# Patient Record
Sex: Female | Born: 1945
Health system: Southern US, Community
[De-identification: ages and names within clinical notes are randomized; demographics above are authoritative.]

## PROBLEM LIST (undated history)

## (undated) DIAGNOSIS — E785 Hyperlipidemia, unspecified: Secondary | ICD-10-CM

## (undated) DIAGNOSIS — I6529 Occlusion and stenosis of unspecified carotid artery: Secondary | ICD-10-CM

## (undated) DIAGNOSIS — I480 Paroxysmal atrial fibrillation: Secondary | ICD-10-CM

## (undated) DIAGNOSIS — I10 Essential (primary) hypertension: Secondary | ICD-10-CM

## (undated) DIAGNOSIS — J302 Other seasonal allergic rhinitis: Secondary | ICD-10-CM

## (undated) DIAGNOSIS — E039 Hypothyroidism, unspecified: Secondary | ICD-10-CM

## (undated) HISTORY — DX: Hyperlipidemia, unspecified: E78.5

## (undated) HISTORY — DX: Occlusion and stenosis of unspecified carotid artery: I65.29

## (undated) HISTORY — DX: Hypothyroidism, unspecified: E03.9

## (undated) HISTORY — PX: APPENDECTOMY: SHX54

## (undated) HISTORY — PX: TUBAL LIGATION: SHX77

## (undated) HISTORY — PX: CERVICAL POLYPECTOMY: SHX88

## (undated) HISTORY — PX: CAROTID ENDARTERECTOMY: SUR193

## (undated) HISTORY — DX: Essential (primary) hypertension: I10

## (undated) HISTORY — DX: Paroxysmal atrial fibrillation: I48.0

---

## 1981-03-25 HISTORY — PX: EXTERNAL EAR SURGERY: SHX627

## 1998-06-21 ENCOUNTER — Other Ambulatory Visit: Admission: RE | Admit: 1998-06-21 | Discharge: 1998-06-21 | Payer: Self-pay | Admitting: *Deleted

## 1999-03-02 HISTORY — PX: DOPPLER ECHOCARDIOGRAPHY: SHX263

## 1999-07-04 ENCOUNTER — Other Ambulatory Visit: Admission: RE | Admit: 1999-07-04 | Discharge: 1999-07-04 | Payer: Self-pay | Admitting: *Deleted

## 2000-08-20 ENCOUNTER — Other Ambulatory Visit: Admission: RE | Admit: 2000-08-20 | Discharge: 2000-08-20 | Payer: Self-pay | Admitting: *Deleted

## 2000-10-16 ENCOUNTER — Emergency Department (HOSPITAL_COMMUNITY): Admission: EM | Admit: 2000-10-16 | Discharge: 2000-10-16 | Payer: Self-pay | Admitting: Emergency Medicine

## 2002-02-09 ENCOUNTER — Other Ambulatory Visit: Admission: RE | Admit: 2002-02-09 | Discharge: 2002-02-09 | Payer: Self-pay | Admitting: *Deleted

## 2004-09-17 ENCOUNTER — Other Ambulatory Visit: Admission: RE | Admit: 2004-09-17 | Discharge: 2004-09-17 | Payer: Self-pay | Admitting: *Deleted

## 2005-12-03 ENCOUNTER — Other Ambulatory Visit: Admission: RE | Admit: 2005-12-03 | Discharge: 2005-12-03 | Payer: Self-pay | Admitting: *Deleted

## 2006-03-05 ENCOUNTER — Other Ambulatory Visit: Admission: RE | Admit: 2006-03-05 | Discharge: 2006-03-05 | Payer: Self-pay | Admitting: *Deleted

## 2007-06-03 ENCOUNTER — Other Ambulatory Visit: Admission: RE | Admit: 2007-06-03 | Discharge: 2007-06-03 | Payer: Self-pay | Admitting: *Deleted

## 2009-07-25 ENCOUNTER — Emergency Department (HOSPITAL_COMMUNITY): Admission: EM | Admit: 2009-07-25 | Discharge: 2009-07-25 | Payer: Self-pay | Admitting: Emergency Medicine

## 2010-01-04 ENCOUNTER — Ambulatory Visit: Payer: Self-pay | Admitting: Cardiology

## 2010-01-25 ENCOUNTER — Ambulatory Visit: Payer: Self-pay | Admitting: Cardiology

## 2010-06-28 ENCOUNTER — Other Ambulatory Visit: Payer: Self-pay | Admitting: Cardiology

## 2010-06-28 DIAGNOSIS — I10 Essential (primary) hypertension: Secondary | ICD-10-CM

## 2010-06-28 MED ORDER — AMLODIPINE BESYLATE 5 MG PO TABS: 5.0000 mg | ORAL_TABLET | Freq: Every day | ORAL | Status: DC

## 2010-06-28 NOTE — Telephone Encounter (Signed)
TRYING TO REFILL  AMLODIPINE 5MG /USES [facility] [phone] #. CALL PT AT HOME # LISTED AND LET HER KNOW WHEN IT HAS BEEN DONE AS SHE IS ON HER LAST PILL TODAY. PLACED CHART IN BOX.

## 2010-06-28 NOTE — Telephone Encounter (Signed)
Called stating [facility] and they have sent [place] request w/o response. LM W/ patient that I escribed refill. Spoke w/ pharmacy stated they have sent electronically x 2.

## 2010-07-24 ENCOUNTER — Other Ambulatory Visit: Payer: Self-pay | Admitting: Cardiology

## 2010-07-25 ENCOUNTER — Other Ambulatory Visit: Payer: Self-pay | Admitting: *Deleted

## 2010-07-25 MED ORDER — LISINOPRIL 20 MG PO TABS: ORAL_TABLET | ORAL | Status: DC

## 2010-07-25 NOTE — Telephone Encounter (Signed)
Refill done.  

## 2010-08-22 ENCOUNTER — Encounter: Payer: Self-pay | Admitting: Nurse Practitioner

## 2010-08-23 ENCOUNTER — Encounter: Payer: Self-pay | Admitting: Nurse Practitioner

## 2010-08-23 ENCOUNTER — Ambulatory Visit (INDEPENDENT_AMBULATORY_CARE_PROVIDER_SITE_OTHER): Payer: MEDICARE | Admitting: Nurse Practitioner

## 2010-08-23 VITALS — BP 160/90 | HR 60 | Ht 70.0 in | Wt 161.6 lb

## 2010-08-23 DIAGNOSIS — E039 Hypothyroidism, unspecified: Secondary | ICD-10-CM | POA: Insufficient documentation

## 2010-08-23 DIAGNOSIS — I4891 Unspecified atrial fibrillation: Secondary | ICD-10-CM

## 2010-08-23 DIAGNOSIS — I480 Paroxysmal atrial fibrillation: Secondary | ICD-10-CM

## 2010-08-23 DIAGNOSIS — I10 Essential (primary) hypertension: Secondary | ICD-10-CM

## 2010-08-23 LAB — LIPID PANEL
Cholesterol: 204 mg/dL — ABNORMAL HIGH (ref 0–200)
HDL: 50.3 mg/dL (ref >39.00)
Total CHOL/HDL Ratio: 4
Triglycerides: 132 mg/dL (ref 0.0–149.0)
VLDL: 26.4 mg/dL (ref 0.0–40.0)

## 2010-08-23 LAB — HEPATIC FUNCTION PANEL
ALT: 28 U/L (ref 0–35)
AST: 29 U/L (ref 0–37)
Albumin: 4.2 g/dL (ref 3.5–5.2)
Alkaline Phosphatase: 76 U/L (ref 39–117)
Bilirubin, Direct: 0.1 mg/dL (ref 0.0–0.3)
Total Bilirubin: 0.7 mg/dL (ref 0.3–1.2)
Total Protein: 7 g/dL (ref 6.0–8.3)

## 2010-08-23 LAB — BASIC METABOLIC PANEL
BUN: 18 mg/dL (ref 6–23)
CO2: 30 mEq/L (ref 19–32)
Calcium: 10.7 mg/dL — ABNORMAL HIGH (ref 8.4–10.5)
Chloride: 105 mEq/L (ref 96–112)
Creatinine, Ser: 0.7 mg/dL (ref 0.4–1.2)
GFR: 83.53 mL/min (ref >60.00)
Glucose, Bld: 104 mg/dL — ABNORMAL HIGH (ref 70–99)
Potassium: 4.1 mEq/L (ref 3.5–5.1)
Sodium: 140 mEq/L (ref 135–145)

## 2010-08-23 LAB — TSH: TSH: 5.97 u[IU]/mL — ABNORMAL HIGH (ref 0.35–5.50)

## 2010-08-23 LAB — LDL CHOLESTEROL, DIRECT: Direct LDL: 156.5 mg/dL

## 2010-08-23 MED ORDER — NEBIVOLOL HCL 5 MG PO TABS: 5.0000 mg | ORAL_TABLET | Freq: Every day | ORAL | Status: DC

## 2010-08-23 NOTE — Assessment & Plan Note (Addendum)
Blood pressure is not controlled. I have tried to explain that she needs additional medicines with the goal being to prevent the long term complications of hypertension. She is very resistant to conventional medicines but will try some low dose Bystolic. I have started her on 5 mg of Bystolic to take daily. She will continue with her Lisinopril and Novasc. She is to continue to monitor her blood pressure at home. We will also check an echo looking for LVH. Maybe if she has some objective data she will be more inclined to take additional medicines. I will have her see Dr. [place] in one month for follow up.  Patient is agreeable to this plan and will call if any problems develop in the interim.

## 2010-08-23 NOTE — Patient Instructions (Signed)
We are going to add Bystolic 5 mg to your regimen to help get your blood pressure down. Continue with your other current medicines. Monitor your blood pressure at home.  Record your readings and bring to your next visit. We are going to check some labs today We are going to get an ultrasound of your heart

## 2010-08-23 NOTE — Assessment & Plan Note (Signed)
She is resistant to conventional medicines. Labs are checked today in follow up.

## 2010-08-23 NOTE — Progress Notes (Signed)
Ashley Berg Date of Birth: Jan 22, 1946   History of Present Illness: Ashley Berg is seen back today for her 6 month appointment. She is seen for Dr. [place]. She notes that she has multiple medical issues. She remains very "over whelmed" with her life. She is caring for her mother with Alzheimer's. She is stressed. She has noted that her blood pressure is going up. She can feel it in her head. Her face turns red. She has a few readings from home which are elevated. She has some swelling in her toes and legs especially if she is on her feet for any substantial length of time. She is taking a multitude of "natural supplements". She has a known thyroid issue that she treats only with supplements. She does not exercise. She does try to watch her salt.   Current Outpatient Prescriptions on File Prior to Visit  Medication Sig Dispense Refill  . amLODipine (NORVASC) 5 MG tablet Take 1 tablet (5 mg total) by mouth daily.  30 tablet  5  . b complex vitamins tablet Take 1 tablet by mouth daily.        . Calcium-Vitamin A-Vitamin D (LIQUID CALCIUM PO) Take 1,200 mg by mouth 1 dose over 24 hours.        . digoxin (LANOXIN) 0.25 MG tablet Take 250 mcg by mouth daily.        . Flaxseed, Linseed, (FLAX SEED OIL) 1000 MG CAPS Take 1,000 mg by mouth 1 dose over 24 hours.        [name] lisinopril (PRINIVIL,ZESTRIL) 20 MG tablet TAKE 1 & 1/2 TABLET DAILY  45 tablet  2  . Misc Natural Products (OSTEO BI-FLEX ADV TRIPLE ST PO) Take 1,500 mg by mouth 1 dose over 24 hours.        . vitamin E 400 UNIT capsule Take 400 Units by mouth every other day.        . Multiple Vitamin (MULTIVITAMIN) tablet Take 1 tablet by mouth daily.        . nebivolol (BYSTOLIC) 5 MG tablet Take 1 tablet (5 mg total) by mouth daily.  30 tablet      Allergies  Allergen Reactions  . Penicillins     Past Medical History  Diagnosis Date  . PAF (paroxysmal atrial fibrillation)   . Hypothyroidism     Refuses conventional medicine  . Hypertension      Past Surgical History  Procedure Date  . Appendectomy   . Tubal ligation   . External ear surgery   . Doppler echocardiography 03/02/1999    NORMAL LEFT VENTRICULAR SIZE. EF 55%    History  Smoking status  . Never Smoker   Smokeless tobacco  . Never Used    History  Alcohol Use No    Family History  Problem Relation Age of Onset  . Stroke Father   . Alzheimer's disease Mother     Review of Systems: The review of systems is positive for significant situational stress. Blood pressure is up. Her rhythm has been ok. She is on just low dose Digoxin for her PAF. No real shortness of breath.  All other systems were reviewed and are negative.  Physical Exam: BP 160/90  Pulse 60  Ht 5\' 10"  (1.778 m)  Wt 161 lb 9.6 oz (73.301 kg)  BMI 23.19 kg/m2 Patient is pleasant and in no acute distress. She does seem a little anxious. Skin is warm and dry. Color is normal.  HEENT is unremarkable  except she appears to have exophthalmus. Normocephalic/atraumatic. PERRL. Sclera are nonicteric. Neck is supple. No masses. No JVD. Lungs are clear. Cardiac exam shows a regular rate and rhythm. Abdomen is soft. Extremities are without edema. Gait and ROM are intact. No gross neurologic deficits noted.  LABORATORY DATA: Pending   Assessment / Plan:

## 2010-08-24 LAB — DIGOXIN LEVEL: Digoxin Level: 1.4 ng/mL (ref 0.8–2.0)

## 2010-08-27 ENCOUNTER — Telehealth: Payer: Self-pay | Admitting: *Deleted

## 2010-08-27 ENCOUNTER — Ambulatory Visit (HOSPITAL_COMMUNITY): Payer: Medicare Other | Attending: Cardiology | Admitting: Radiology

## 2010-08-27 DIAGNOSIS — I10 Essential (primary) hypertension: Secondary | ICD-10-CM | POA: Insufficient documentation

## 2010-08-27 DIAGNOSIS — I480 Paroxysmal atrial fibrillation: Secondary | ICD-10-CM

## 2010-08-27 DIAGNOSIS — I4891 Unspecified atrial fibrillation: Secondary | ICD-10-CM | POA: Insufficient documentation

## 2010-08-27 DIAGNOSIS — I059 Rheumatic mitral valve disease, unspecified: Secondary | ICD-10-CM | POA: Insufficient documentation

## 2010-08-27 DIAGNOSIS — I079 Rheumatic tricuspid valve disease, unspecified: Secondary | ICD-10-CM | POA: Insufficient documentation

## 2010-08-27 NOTE — Telephone Encounter (Signed)
Message copied by Lorayne Bender on Mon Aug 27, 2010  4:06 PM ------      Message from: Rosalio Macadamia      Created: Fri Aug 24, 2010  8:16 AM       Ok to report. Cholesterol levels remain very elevated. Statin therapy is recommended if she is agreeable. TSH remains slightly elevated. Dig level is ok.

## 2010-08-27 NOTE — Telephone Encounter (Signed)
Notified of lab results. Wants to think about going on statin. Doesn't think she "can tolerate" any more medication. Will call us back if changes her mind. BP today was 179/ when getting Echo.

## 2010-08-27 NOTE — Telephone Encounter (Signed)
Lm to call back for lab results. Also she dropped off two boxes of Bystolic. States she couldn't take it. "made her feel funny".

## 2010-09-03 ENCOUNTER — Telehealth: Payer: Self-pay | Admitting: *Deleted

## 2010-09-03 NOTE — Telephone Encounter (Signed)
Message copied by Lorayne Bender on Mon Sep 03, 2010  4:22 PM ------      Message from: [place], PETER M      Created: Mon Sep 03, 2010 12:14 PM       Echo is normal please report.

## 2010-09-04 ENCOUNTER — Telehealth: Payer: Self-pay | Admitting: *Deleted

## 2010-09-04 DIAGNOSIS — R5383 Other fatigue: Secondary | ICD-10-CM

## 2010-09-04 MED ORDER — LEVOTHYROXINE SODIUM 100 MCG PO TABS: 100.0000 ug | ORAL_TABLET | Freq: Every day | ORAL | Status: DC

## 2010-09-04 NOTE — Telephone Encounter (Signed)
Returned call. Notified of echo results. Also per Dr. [place] wants to start her on Synthroid. [facility] Made 3 mov lab app to check TSH.

## 2010-09-08 ENCOUNTER — Other Ambulatory Visit: Payer: Self-pay | Admitting: Cardiology

## 2010-09-10 NOTE — Telephone Encounter (Signed)
Med refill

## 2010-09-13 ENCOUNTER — Telehealth: Payer: Self-pay | Admitting: Cardiology

## 2010-09-13 NOTE — Telephone Encounter (Signed)
Called in to report that her medication Levothyroxine is making her jittery on the inside and is making her feel hyper. She feels that it is too hig of a dose. Please call back. I have pulled the chart.

## 2010-09-13 NOTE — Telephone Encounter (Signed)
Called stating she is having "reaction" to Levothyroxine. States ever since starting med she has felt "jittery" and very "hyper". Per Dr. [place] can decrease from 100 mcg to 50 mcg but will need the higher dose later.

## 2010-09-21 ENCOUNTER — Ambulatory Visit (INDEPENDENT_AMBULATORY_CARE_PROVIDER_SITE_OTHER): Payer: MEDICARE | Admitting: Cardiology

## 2010-09-21 ENCOUNTER — Other Ambulatory Visit: Payer: Self-pay | Admitting: *Deleted

## 2010-09-21 ENCOUNTER — Encounter: Payer: Self-pay | Admitting: Cardiology

## 2010-09-21 DIAGNOSIS — E039 Hypothyroidism, unspecified: Secondary | ICD-10-CM

## 2010-09-21 DIAGNOSIS — I10 Essential (primary) hypertension: Secondary | ICD-10-CM

## 2010-09-21 DIAGNOSIS — R5383 Other fatigue: Secondary | ICD-10-CM

## 2010-09-21 NOTE — Assessment & Plan Note (Signed)
Her blood pressure readings at home have been well controlled. Her pressure is elevated today here. We will continue with her current antihypertensives and followup again in 3 months.

## 2010-09-21 NOTE — Assessment & Plan Note (Signed)
We will recheck her thyroid studies in September. She will continue on her current replacement therapy pending these results.

## 2010-09-21 NOTE — Patient Instructions (Signed)
We will recheck your thyroid levels in September.  I will see you back in 3 months.  Keep up the good work.

## 2010-09-21 NOTE — Progress Notes (Signed)
Ashley Berg Date of Birth: [birth year]   History of Present Illness: Ashley Berg is seen back today for a followup visit. She reports she was unable to tolerate bystolic. She states this made her heart feel like it was getting ready to speed up. She stopped it after 2 doses. She has been taking an herbal supplement for fluid because her toes got plump. She also felt congested her head. Since then she has felt much better. She does bring daily blood pressure readings over the past month which demonstrate good blood pressure control with systolics ranging from 116-134 and diastolics ranging from 66-76. She is currently taking 50 mcg of Synthroid in addition to her own herbal thyroid supplement. She is feeling much better today.  Current Outpatient Prescriptions on File Prior to Visit  Medication Sig Dispense Refill  . amLODipine (NORVASC) 5 MG tablet Take 1 tablet (5 mg total) by mouth daily.  30 tablet  5  . b complex vitamins tablet Take 1 tablet by mouth daily.        . digoxin (LANOXIN) 0.25 MG tablet TAKE 1 TBALET BY MOUTH DAILY  30 tablet  5  . Flaxseed, Linseed, (FLAX SEED OIL) 1000 MG CAPS Take 1,000 mg by mouth 1 dose over 24 hours.        [name] lisinopril (PRINIVIL,ZESTRIL) 20 MG tablet TAKE 1 & 1/2 TABLET DAILY  45 tablet  2  . Misc Natural Products (OSTEO BI-FLEX ADV TRIPLE ST PO) Take 1,500 mg by mouth 1 dose over 24 hours.        . vitamin E 400 UNIT capsule Take 400 Units by mouth every other day.        [name] DISCONTD: levothyroxine (SYNTHROID) 100 MCG tablet Take 1 tablet (100 mcg total) by mouth daily.  30 tablet  5  . DISCONTD: Calcium-Vitamin A-Vitamin D (LIQUID CALCIUM PO) Take 1,200 mg by mouth 1 dose over 24 hours.        [name] DISCONTD: Multiple Vitamin (MULTIVITAMIN) tablet Take 1 tablet by mouth daily.        [name] DISCONTD: nebivolol (BYSTOLIC) 5 MG tablet Take 1 tablet (5 mg total) by mouth daily.  30 tablet      Allergies  Allergen Reactions  . Penicillins     Past Medical  History  Diagnosis Date  . PAF (paroxysmal atrial fibrillation)   . Hypothyroidism     Refuses conventional medicine  . Hypertension     Past Surgical History  Procedure Date  . Appendectomy   . Tubal ligation   . External ear surgery   . Doppler echocardiography 03/02/1999    NORMAL LEFT VENTRICULAR SIZE. EF 55%    History  Smoking status  . Never Smoker   Smokeless tobacco  . Never Used    History  Alcohol Use No    Family History  Problem Relation Age of Onset  . Stroke Father   . Alzheimer's disease Mother     Review of Systems: The review of systems is positive for significant situational stress.  No real shortness of breath.  All other systems were reviewed and are negative.  Physical Exam: BP 162/90  Pulse 82  Ht 5' 10.5" (1.791 m)  Wt 156 lb (70.761 kg)  BMI 22.07 kg/m2 Patient is pleasant and in no acute distress. She does seem a little anxious. Skin is warm and dry. Color is normal.  HEENT is unremarkable except she appears to have exophthalmus. Normocephalic/atraumatic. PERRL. Sclera are  nonicteric. Neck is supple. No masses. No JVD. Lungs are clear. Cardiac exam shows a regular rate and rhythm. Abdomen is soft. Extremities are without edema. Gait and ROM are intact. No gross neurologic deficits noted.  LABORATORY DATA: Pending   Assessment / Plan:

## 2010-11-18 ENCOUNTER — Other Ambulatory Visit: Payer: Self-pay | Admitting: Cardiology

## 2010-11-19 NOTE — Telephone Encounter (Signed)
escribe medication per fax request  

## 2010-12-06 ENCOUNTER — Other Ambulatory Visit (INDEPENDENT_AMBULATORY_CARE_PROVIDER_SITE_OTHER): Payer: MEDICARE | Admitting: *Deleted

## 2010-12-06 ENCOUNTER — Other Ambulatory Visit: Payer: Medicare Other | Admitting: *Deleted

## 2010-12-06 DIAGNOSIS — R5383 Other fatigue: Secondary | ICD-10-CM

## 2010-12-06 DIAGNOSIS — R5381 Other malaise: Secondary | ICD-10-CM

## 2010-12-06 LAB — TSH: TSH: 2.69 u[IU]/mL (ref 0.35–5.50)

## 2010-12-07 ENCOUNTER — Telehealth: Payer: Self-pay | Admitting: *Deleted

## 2010-12-07 NOTE — Telephone Encounter (Signed)
Message copied by Lorayne Bender on Fri Dec 07, 2010  4:59 PM ------      Message from: [place], PETER M      Created: Fri Dec 07, 2010  8:28 AM       Thyroid studies are normal! Continue current RX.            Theron Arista [place]

## 2010-12-07 NOTE — Telephone Encounter (Signed)
Lm w/ thyroid results, continue same meds.

## 2010-12-19 ENCOUNTER — Ambulatory Visit (INDEPENDENT_AMBULATORY_CARE_PROVIDER_SITE_OTHER): Payer: MEDICARE | Admitting: Cardiology

## 2010-12-19 ENCOUNTER — Other Ambulatory Visit: Payer: Self-pay | Admitting: Cardiology

## 2010-12-19 ENCOUNTER — Encounter: Payer: Self-pay | Admitting: Cardiology

## 2010-12-19 DIAGNOSIS — E039 Hypothyroidism, unspecified: Secondary | ICD-10-CM

## 2010-12-19 DIAGNOSIS — I10 Essential (primary) hypertension: Secondary | ICD-10-CM

## 2010-12-19 DIAGNOSIS — E7800 Pure hypercholesterolemia, unspecified: Secondary | ICD-10-CM

## 2010-12-19 MED ORDER — OLMESARTAN-AMLODIPINE-HCTZ 20-5-12.5 MG PO TABS: 1.0000 | ORAL_TABLET | Freq: Every day | ORAL | Status: DC

## 2010-12-19 NOTE — Patient Instructions (Signed)
If affordable we will switch your BP medication to Tribenzor 20-5-12.5 mg daily. You would need to stop the lisinopril and amlodipine when you start this.  Monitor your BP at home.  I will see you again in 6 months.

## 2010-12-19 NOTE — Telephone Encounter (Signed)
Pt was seen today and [facility] [address]  She went to pick up and they have no record. Please resend Tribenzor to the [facility]. Pt number is [phone].  Phar number is [phone].

## 2010-12-19 NOTE — Telephone Encounter (Signed)
Lm that resent Rx Trib[facility] to [facility] [address]

## 2010-12-20 ENCOUNTER — Telehealth: Payer: Self-pay | Admitting: Cardiology

## 2010-12-20 NOTE — Progress Notes (Signed)
    Ashley Berg Date of Birth: [birth year]   History of Present Illness: [name] is seen back today for a followup visit. She reports that she is feeling well. She is now taking care of her mother at home. She has not been checking her blood pressure recently. She continues to take lisinopril and amlodipine. She has had no significant tachycardia. She denies any chest pain or shortness of breath.  Current Outpatient Prescriptions on File Prior to Visit  Medication Sig Dispense Refill  . amLODipine (NORVASC) 5 MG tablet Take 1 tablet (5 mg total) by mouth daily.  30 tablet  5  . b complex vitamins tablet Take 1 tablet by mouth daily.        . Calcium-Magnesium-Vitamin D (CALCIUM MAGNESIUM PO) Take 1 tablet by mouth daily.        . digoxin (LANOXIN) 0.25 MG tablet TAKE 1 TBALET BY MOUTH DAILY  30 tablet  5  . Flaxseed, Linseed, (FLAX SEED OIL) 1000 MG CAPS Take 1,000 mg by mouth 1 dose over 24 hours.        [name] levothyroxine (SYNTHROID, LEVOTHROID) 100 MCG tablet Take 100 mcg by mouth daily. Take 1/2 tablet daily        . lisinopril (PRINIVIL,ZESTRIL) 20 MG tablet TAKE 1 & 1/2 TABLET DAILY  45 tablet  5  . Misc Natural Products (OSTEO BI-FLEX ADV TRIPLE ST PO) Take 1,500 mg by mouth 1 dose over 24 hours.        . vitamin E 400 UNIT capsule Take 400 Units by mouth every other day.          Allergies  Allergen Reactions  . Penicillins     Past Medical History  Diagnosis Date  . PAF (paroxysmal atrial fibrillation)   . Hypothyroidism     Refuses conventional medicine  . Hypertension     Past Surgical History  Procedure Date  . Appendectomy   . Tubal ligation   . External ear surgery   . Doppler echocardiography 03/02/1999    NORMAL LEFT VENTRICULAR SIZE. EF 55%    History  Smoking status  . Never Smoker   Smokeless tobacco  . Never Used    History  Alcohol Use No    Family History  Problem Relation Age of Onset  . Stroke Father   . Alzheimer's disease Mother      Review of Systems: The review of systems is positive for significant situational stress.  No real shortness of breath.  All other systems were reviewed and are negative.  Physical Exam: BP 164/92  Pulse 66  Ht 5\' 10"  (1.778 m)  Wt 163 lb (73.936 kg)  BMI 23.39 kg/m2 Patient is pleasant and in no acute distress. She does seem a little anxious. Skin is warm and dry. Color is normal.  HEENT is unremarkable except she appears to have exophthalmus. Normocephalic/atraumatic. PERRL. Sclera are nonicteric. Neck is supple. No masses. No JVD. Lungs are clear. Cardiac exam shows a regular rate and rhythm. Abdomen is soft. Extremities are without edema. Gait and ROM are intact. No gross neurologic deficits noted.  LABORATORY DATA:    Assessment / Plan:

## 2010-12-20 NOTE — Assessment & Plan Note (Signed)
Blood pressure is elevated today. She has always had a component of white coat syndrome. She states she doesn't like to take medication unless it's derived from a root or plant. She has heard about Tribenzor from a friend and wants to try this. On checking with her pharmacy this will cost her $160 a month. I suggest adding a low dose of HCTZ to her current regimen which would be pretty equivalent. I recommended she monitor her blood pressure at home and we will followup again in 6 months.

## 2010-12-20 NOTE — Assessment & Plan Note (Signed)
Recent thyroid function studies were normal with a TSH of 2.69. She will continue with her current thyroid replacement.

## 2010-12-20 NOTE — Telephone Encounter (Signed)
Pt calling needing prior (stating that it is medically necessary) authorization for pt to fill tribenzor.   Please call [employer] [facility] [phone]  Please return pt call with any questions.

## 2010-12-21 ENCOUNTER — Other Ambulatory Visit: Payer: Self-pay | Admitting: Cardiology

## 2010-12-21 NOTE — Telephone Encounter (Signed)
Lm that we had received prior authorization form and was faxed back to [facility] at [phone] for her Tribenzor 20/5/12.5.

## 2010-12-25 ENCOUNTER — Telehealth: Payer: Self-pay | Admitting: Cardiology

## 2010-12-25 MED ORDER — HYDROCHLOROTHIAZIDE 12.5 MG PO CAPS: 12.5000 mg | ORAL_CAPSULE | Freq: Every day | ORAL | Status: DC

## 2010-12-25 NOTE — Telephone Encounter (Signed)
Called requesting refill on HCTZ, also wanted to know status of prior auth on Tribenzor. Will call Wed to follow up. Have not heard anything back from them. Left her message.

## 2010-12-25 NOTE — Telephone Encounter (Signed)
Pt calling needing refill of hydrochlorothiazide 12.[facility] mg [facility] [address]   Pt also wants to know about pre-authorization of tribenzor.

## 2010-12-26 ENCOUNTER — Telehealth: Payer: Self-pay | Admitting: *Deleted

## 2010-12-26 NOTE — Telephone Encounter (Signed)
Advised that I called [employer] at [phone] to see if Tribenzor had been approved. They state that it has been denied. Would have to try Diovan or Exforge. Per Dr. [place] advised to stay on current meds. Notified Ashley Berg that the insurance denied [employer]. She was advised to stay on current medication and to continue to monitor BP. She is agreeable.

## 2011-03-04 ENCOUNTER — Other Ambulatory Visit: Payer: Self-pay | Admitting: Cardiology

## 2011-06-14 ENCOUNTER — Encounter: Payer: Self-pay | Admitting: Cardiology

## 2011-06-14 ENCOUNTER — Ambulatory Visit (INDEPENDENT_AMBULATORY_CARE_PROVIDER_SITE_OTHER): Payer: MEDICARE | Admitting: Cardiology

## 2011-06-14 VITALS — BP 142/68 | HR 71 | Ht 70.0 in | Wt 161.0 lb

## 2011-06-14 DIAGNOSIS — I10 Essential (primary) hypertension: Secondary | ICD-10-CM

## 2011-06-14 DIAGNOSIS — E039 Hypothyroidism, unspecified: Secondary | ICD-10-CM

## 2011-06-14 NOTE — Progress Notes (Signed)
    Ashley Berg Date of Birth: Sep 13, 1945   History of Present Illness: Ashley Berg is seen back today for a followup visit. She reports that she is doing well. She still gets very anxious about taking her blood pressure and so hasn't been monitoring at home. She has switched to Tribenzor she thinks she is tolerating it well with the exception of feeling "strange".  Current Outpatient Prescriptions on File Prior to Visit  Medication Sig Dispense Refill  . b complex vitamins tablet Take 1 tablet by mouth daily.        . Flaxseed, Linseed, (FLAX SEED OIL) 1000 MG CAPS Take 1,000 mg by mouth 1 dose over 24 hours.        [name] levothyroxine (SYNTHROID, LEVOTHROID) 100 MCG tablet Take 100 mcg by mouth daily. Take 1/2 tablet daily        . Misc Natural Products (OSTEO BI-FLEX ADV TRIPLE ST PO) Take 1,500 mg by mouth 1 dose over 24 hours.        . Olmesartan-Amlodipine-HCTZ (TRIBENZOR) 20-5-12.5 MG TABS Take 1 tablet by mouth daily.  30 tablet  5  . vitamin E 400 UNIT capsule Take 400 Units by mouth every other day.        . Calcium-Magnesium-Vitamin D (CALCIUM MAGNESIUM PO) Take 1 tablet by mouth daily.          Allergies  Allergen Reactions  . Penicillins     Past Medical History  Diagnosis Date  . PAF (paroxysmal atrial fibrillation)   . Hypothyroidism     Refuses conventional medicine  . Hypertension     Past Surgical History  Procedure Date  . Appendectomy   . Tubal ligation   . External ear surgery   . Doppler echocardiography 03/02/1999    NORMAL LEFT VENTRICULAR SIZE. EF 55%    History  Smoking status  . Never Smoker   Smokeless tobacco  . Never Used    History  Alcohol Use No    Family History  Problem Relation Age of Onset  . Stroke Father   . Alzheimer's disease Mother     Review of Systems: The review of systems is positive for  some anxiety.  No  shortness of breath.  All other systems were reviewed and are negative.  Physical Exam: BP 142/68  Pulse 71  Ht  5\' 10"  (1.778 m)  Wt 161 lb (73.029 kg)  BMI 23.10 kg/m2 Patient is pleasant and in no acute distress. She does seem a little anxious. Skin is warm and dry. Color is normal.  HEENT is unremarkable except she appears to have exophthalmus. Normocephalic/atraumatic. PERRL. Sclera are nonicteric. Neck is supple. No masses. No JVD. Lungs are clear. Cardiac exam shows a regular rate and rhythm. Abdomen is soft. Extremities are without edema. Gait and ROM are intact. No gross neurologic deficits noted.  LABORATORY DATA:  ECG today shows normal sinus rhythm with nonspecific ST abnormality.  Assessment / Plan:

## 2011-06-14 NOTE — Assessment & Plan Note (Signed)
Her last TSH was normal. We will continue to monitor

## 2011-06-14 NOTE — Patient Instructions (Signed)
Continue your current medication.  I will see you again in 6 months with lab work.     

## 2011-06-14 NOTE — Assessment & Plan Note (Signed)
Repeat blood pressure today was satisfactory. We will continue with her current medical therapy. Continue sodium restriction. Will followup again in 6 months with fasting lab work.

## 2011-07-22 ENCOUNTER — Telehealth: Payer: Self-pay | Admitting: Cardiology

## 2011-07-22 MED ORDER — OLMESARTAN-AMLODIPINE-HCTZ 20-5-12.5 MG PO TABS: 1.0000 | ORAL_TABLET | Freq: Every day | ORAL | Status: DC

## 2011-07-22 MED ORDER — DIGOXIN 250 MCG PO TABS: 250.0000 ug | ORAL_TABLET | Freq: Every day | ORAL | Status: DC

## 2011-07-22 MED ORDER — LEVOTHYROXINE SODIUM 100 MCG PO TABS: 100.0000 ug | ORAL_TABLET | Freq: Every day | ORAL | Status: DC

## 2011-07-22 NOTE — Telephone Encounter (Signed)
Patient called wanting 90 day prescriptions sent to [facility] [address].

## 2011-07-22 NOTE — Telephone Encounter (Signed)
New PRoblem:     Patient called in wanting her Digoxin, levothyroxine (SYNTHROID, LEVOTHROID) 100 MCG tablet, Olmesartan-Amlodipine-HCTZ (TRIBENZOR) 20-5-12.5 MG TABS switched to 90 day refills from now on.  Please call back if you have any questions.

## 2011-07-26 ENCOUNTER — Telehealth: Payer: Self-pay | Admitting: Cardiology

## 2011-07-26 NOTE — Telephone Encounter (Signed)
Pt wants to know what kind of thyroid condition she has. She also wants talk about what is the difference between thyroid conditions and how they are treated.

## 2011-07-26 NOTE — Telephone Encounter (Signed)
Patient called was told she has hypothyroidism.States she has her mother with her and she has a thyroid problem too.States she did not understand why her mothers Dr.decreased her thyroid medication,was told she would need to call her Dr.to find out.

## 2012-07-27 ENCOUNTER — Other Ambulatory Visit: Payer: Self-pay | Admitting: Cardiology

## 2012-08-13 ENCOUNTER — Ambulatory Visit (INDEPENDENT_AMBULATORY_CARE_PROVIDER_SITE_OTHER): Payer: MEDICARE | Admitting: Physician Assistant

## 2012-08-13 ENCOUNTER — Encounter: Payer: Self-pay | Admitting: Physician Assistant

## 2012-08-13 VITALS — BP 148/88 | HR 68 | Ht 70.0 in | Wt 153.8 lb

## 2012-08-13 DIAGNOSIS — I10 Essential (primary) hypertension: Secondary | ICD-10-CM

## 2012-08-13 DIAGNOSIS — E039 Hypothyroidism, unspecified: Secondary | ICD-10-CM

## 2012-08-13 DIAGNOSIS — I4891 Unspecified atrial fibrillation: Secondary | ICD-10-CM

## 2012-08-13 DIAGNOSIS — R0989 Other specified symptoms and signs involving the circulatory and respiratory systems: Secondary | ICD-10-CM

## 2012-08-13 MED ORDER — OLMESARTAN-AMLODIPINE-HCTZ 20-5-12.5 MG PO TABS: 12.5000 mg | ORAL_TABLET | Freq: Every day | ORAL | Status: DC

## 2012-08-13 MED ORDER — LEVOTHYROXINE SODIUM 100 MCG PO TABS: 100.0000 ug | ORAL_TABLET | Freq: Every day | ORAL | Status: DC

## 2012-08-13 NOTE — Patient Instructions (Addendum)
Your physician recommends that you continue on your current medications as directed. Please refer to the Current Medication list given to you today. I changed all your prescriptions medications to 90 day supplies  Your physician recommends that you return for a FASTING lipid profile/bmet/tsh/lft/lipid/digoxin the same morning you have your carotid doppler and take your digoxin after you have labs drawn   Your physician has requested that you have a carotid duplex. Left Bruit. This test is an ultrasound of the carotid arteries in your neck. It looks at blood flow through these arteries that supply the brain with blood. Allow one hour for this exam. There are no restrictions or special instructions.  Your physician wants you to follow-up in: 1 year with Dr. Thomasene Lot.  You will receive a reminder letter in the mail two months in advance. If you don't receive a letter, please call our office to schedule the follow-up appointment at [phone].

## 2012-08-13 NOTE — Progress Notes (Signed)
[address], [address] [place], [place]  [address] Phone: [phone] Fax:  [phone]  Date:  08/13/2012   ID:  Ashley Berg, DOB 1946/03/10, MRN [id number]  PCP:  Ashley Sidle, MD  Cardiologist:  Dr. Peter Berg     History of Present Illness: Ashley Berg is a 67 y.o. female who returns for f/u.  She has a history of HTN, paroxysmal AFib and hypothyroidism. Echocardiogram 08/2010: Mild LVH, vigorous LV function, EF 65-70%, grade 1 diastolic dysfunction.  Last seen by Dr. Swaziland 05/2011.  Since last seen, she is doing well.  The patient denies chest pain, shortness of breath, syncope, orthopnea, PND or significant pedal edema.  She does note an audible pulse in both ears at times.    Labs (5/12):  K 4.1, Cr 0.7, ALT 28, LDL 156.5  Labs (9/12):  TSH 2.69  Wt Readings from Last 3 Encounters:  08/13/12 153 lb 12.8 oz (69.763 kg)  06/14/11 161 lb (73.029 kg)  12/19/10 163 lb (73.936 kg)     Past Medical History  Diagnosis Date  . PAF (paroxysmal atrial fibrillation)   . Hypothyroidism     Refuses conventional medicine  . Hypertension     Current Outpatient Prescriptions  Medication Sig Dispense Refill  . b complex vitamins tablet Take 1 tablet by mouth daily.        . Calcium-Magnesium-Vitamin D (CALCIUM MAGNESIUM PO) Take by mouth. Pt takes suspension 2 tbsp      . DIGOX 250 MCG tablet TAKE 1 TABLET EVERY DAY  90 tablet  3  . Flaxseed, Linseed, (FLAX SEED OIL) 1000 MG CAPS Take 1,000 mg by mouth 1 day or 1 dose.       . levothyroxine (SYNTHROID, LEVOTHROID) 100 MCG tablet Take 100 mcg by mouth daily. Take 1/2 tablet daily      . Misc Natural Products (OSTEO BI-FLEX ADV TRIPLE ST PO) Take 1,500 mg by mouth 1 dose over 24 hours.        [name] 20-5-12.5 MG TABS TAKE 1 TABLET BY MOUTH DAILY.  90 tablet  3  . vitamin E 400 UNIT capsule Take 400 Units by mouth every other day.        . [DISCONTINUED] hydrochlorothiazide (MICROZIDE) 12.5 MG capsule Take 1  capsule (12.5 mg total) by mouth daily.  30 capsule  5   No current facility-administered medications for this visit.    Allergies:    Allergies  Allergen Reactions  . Penicillins     Social History:  The patient  reports that she has never smoked. She has never used smokeless tobacco. She reports that she does not drink alcohol or use illicit drugs.   ROS:  Please see the history of present illness.      All other systems reviewed and negative.   PHYSICAL EXAM: VS:  BP 148/88  Pulse 68  Ht 5\' 10"  (1.778 m)  Wt 153 lb 12.8 oz (69.763 kg)  BMI 22.07 kg/m2 Well nourished, well developed, in no acute distress HEENT: normal Neck: no JVD Vascular:  + L carotid bruit Cardiac:  normal S1, S2; RRR; no murmur Lungs:  clear to auscultation bilaterally, no wheezing, rhonchi or rales Abd: soft, nontender, no hepatomegaly Ext: no edema Skin: warm and dry Neuro:  CNs 2-12 intact, no focal abnormalities noted  EKG:  NSR, HR 68, right axis, NSSTTW changes     ASSESSMENT AND PLAN:  1. Hypertension:  Fair control.  Continue current  medications.  Arrange f/u BMET. 2. Hyperlipidemia:  Arrange f/u lipids and LFTs. 3. Left Carotid Bruit:  Arrange carotid dopplers. 4. Hypothyroidism:  Arrange f/u TSH. 5. Paroxysmal AFib:  Maintaining NSR.  She does not want to take ASA.  Check Digoxin level with next lab draw. 6. Disposition:  F/u with Dr. Peter Berg in 1 year.   Signed, Tereso Newcomer, PA-C  08/13/2012 2:42 PM

## 2012-08-27 ENCOUNTER — Other Ambulatory Visit: Payer: MEDICARE

## 2012-09-08 ENCOUNTER — Ambulatory Visit (INDEPENDENT_AMBULATORY_CARE_PROVIDER_SITE_OTHER): Payer: MEDICARE

## 2012-09-08 ENCOUNTER — Encounter (INDEPENDENT_AMBULATORY_CARE_PROVIDER_SITE_OTHER): Payer: MEDICARE

## 2012-09-08 ENCOUNTER — Encounter: Payer: Self-pay | Admitting: Physician Assistant

## 2012-09-08 DIAGNOSIS — I4891 Unspecified atrial fibrillation: Secondary | ICD-10-CM

## 2012-09-08 DIAGNOSIS — I6529 Occlusion and stenosis of unspecified carotid artery: Secondary | ICD-10-CM

## 2012-09-08 DIAGNOSIS — I10 Essential (primary) hypertension: Secondary | ICD-10-CM

## 2012-09-08 DIAGNOSIS — R0989 Other specified symptoms and signs involving the circulatory and respiratory systems: Secondary | ICD-10-CM

## 2012-09-08 DIAGNOSIS — E039 Hypothyroidism, unspecified: Secondary | ICD-10-CM

## 2012-09-08 NOTE — Patient Instructions (Signed)
REFER TO [facility] DX CAROTID STENOSIS

## 2012-09-09 ENCOUNTER — Telehealth: Payer: Self-pay | Admitting: *Deleted

## 2012-09-09 LAB — HEPATIC FUNCTION PANEL
ALT: 22 U/L (ref 0–35)
AST: 28 U/L (ref 0–37)
Alkaline Phosphatase: 70 U/L (ref 39–117)
Bilirubin, Direct: 0.1 mg/dL (ref 0.0–0.3)
Total Bilirubin: 1 mg/dL (ref 0.3–1.2)

## 2012-09-09 LAB — BASIC METABOLIC PANEL
CO2: 25 mEq/L (ref 19–32)
Chloride: 100 mEq/L (ref 96–112)
Creatinine, Ser: 0.8 mg/dL (ref 0.4–1.2)
Potassium: 3.7 mEq/L (ref 3.5–5.1)

## 2012-09-09 LAB — LIPID PANEL
Cholesterol: 206 mg/dL — ABNORMAL HIGH (ref 0–200)
Total CHOL/HDL Ratio: 4
VLDL: 18.8 mg/dL (ref 0.0–40.0)

## 2012-09-09 LAB — LDL CHOLESTEROL, DIRECT: Direct LDL: 144.4 mg/dL

## 2012-09-09 LAB — DIGOXIN LEVEL: Digoxin Level: 0.9 ng/mL (ref 0.8–2.0)

## 2012-09-09 NOTE — Telephone Encounter (Signed)
pt notified about digoxin level normal, advised pt will cb when other lab results are in, pt said ok

## 2012-09-09 NOTE — Telephone Encounter (Signed)
Message copied by Tarri Fuller on Wed Sep 09, 2012  9:56 AM ------      Message from: [place], [place] T      Created: Wed Sep 09, 2012  8:15 AM       Digoxin level ok      Continue with current treatment plan.      Tereso Newcomer, PA-C        09/09/2012 8:14 AM ------

## 2012-09-11 ENCOUNTER — Other Ambulatory Visit: Payer: Self-pay

## 2012-09-11 ENCOUNTER — Telehealth: Payer: Self-pay | Admitting: *Deleted

## 2012-09-11 MED ORDER — ATORVASTATIN CALCIUM 40 MG PO TABS: 40.0000 mg | ORAL_TABLET | Freq: Every day | ORAL | Status: DC

## 2012-09-11 NOTE — Telephone Encounter (Signed)
Message copied by Tarri Fuller on Fri Sep 11, 2012  2:18 PM ------      Message from: [place], [place] T      Created: Wed Sep 09, 2012  9:42 PM       K+ and creatinine ok      LFTs ok      TSH normal      LDL 144 - with critical carotid stenosis, she really should be on a statin       I recommend she take Lipitor 40 mg QD      Repeat FLP and LFTs in 8 weeks      Tereso Newcomer, PA-C        09/09/2012 9:40 PM ------

## 2012-09-11 NOTE — Telephone Encounter (Signed)
pt states she does not want to start lipitor and would really prefer everything go through Dr. Swaziland. Said she did not want to offend Acupuncturist. Asked if Dr. Swaziland could maybe call her next week. I told her I will pass the message on. pt said thank you

## 2012-09-17 ENCOUNTER — Other Ambulatory Visit: Payer: Self-pay

## 2012-09-17 DIAGNOSIS — E7800 Pure hypercholesterolemia, unspecified: Secondary | ICD-10-CM

## 2012-09-21 ENCOUNTER — Encounter: Payer: Self-pay | Admitting: Vascular Surgery

## 2012-09-22 ENCOUNTER — Other Ambulatory Visit: Payer: Self-pay | Admitting: *Deleted

## 2012-09-22 ENCOUNTER — Encounter: Payer: Self-pay | Admitting: Vascular Surgery

## 2012-09-22 ENCOUNTER — Ambulatory Visit (INDEPENDENT_AMBULATORY_CARE_PROVIDER_SITE_OTHER): Payer: MEDICARE | Admitting: Vascular Surgery

## 2012-09-22 ENCOUNTER — Encounter (HOSPITAL_COMMUNITY): Payer: Self-pay

## 2012-09-22 ENCOUNTER — Encounter (HOSPITAL_COMMUNITY)
Admission: RE | Admit: 2012-09-22 | Discharge: 2012-09-22 | Disposition: A | Discharge status: Home / Self Care | Payer: MEDICARE | Source: Ambulatory Visit | Attending: Vascular Surgery | Admitting: Vascular Surgery

## 2012-09-22 ENCOUNTER — Encounter: Payer: Self-pay | Admitting: *Deleted

## 2012-09-22 ENCOUNTER — Ambulatory Visit (HOSPITAL_COMMUNITY)
Admission: RE | Admit: 2012-09-22 | Discharge: 2012-09-22 | Disposition: A | Discharge status: Home / Self Care | Payer: MEDICARE | Source: Ambulatory Visit | Attending: Anesthesiology | Admitting: Anesthesiology

## 2012-09-22 ENCOUNTER — Encounter (HOSPITAL_COMMUNITY): Payer: Self-pay | Admitting: Pharmacy Technician

## 2012-09-22 DIAGNOSIS — Z01818 Encounter for other preprocedural examination: Secondary | ICD-10-CM | POA: Insufficient documentation

## 2012-09-22 DIAGNOSIS — I4891 Unspecified atrial fibrillation: Secondary | ICD-10-CM | POA: Insufficient documentation

## 2012-09-22 DIAGNOSIS — I10 Essential (primary) hypertension: Secondary | ICD-10-CM | POA: Insufficient documentation

## 2012-09-22 DIAGNOSIS — E039 Hypothyroidism, unspecified: Secondary | ICD-10-CM | POA: Insufficient documentation

## 2012-09-22 DIAGNOSIS — Z0183 Encounter for blood typing: Secondary | ICD-10-CM | POA: Insufficient documentation

## 2012-09-22 DIAGNOSIS — I658 Occlusion and stenosis of other precerebral arteries: Secondary | ICD-10-CM | POA: Insufficient documentation

## 2012-09-22 DIAGNOSIS — Z01812 Encounter for preprocedural laboratory examination: Secondary | ICD-10-CM | POA: Insufficient documentation

## 2012-09-22 DIAGNOSIS — I6529 Occlusion and stenosis of unspecified carotid artery: Secondary | ICD-10-CM

## 2012-09-22 HISTORY — DX: Other seasonal allergic rhinitis: J30.2

## 2012-09-22 LAB — URINALYSIS, ROUTINE W REFLEX MICROSCOPIC
Glucose, UA: NEGATIVE mg/dL
Hgb urine dipstick: NEGATIVE
Protein, ur: NEGATIVE mg/dL
pH: 6 (ref 5.0–8.0)

## 2012-09-22 LAB — CBC
MCH: 33.1 pg (ref 26.0–34.0)
MCV: 94.2 fL (ref 78.0–100.0)
Platelets: 191 10*3/uL (ref 150–400)
RDW: 13.8 % (ref 11.5–15.5)

## 2012-09-22 LAB — COMPREHENSIVE METABOLIC PANEL
AST: 28 U/L (ref 0–37)
Albumin: 4.5 g/dL (ref 3.5–5.2)
Calcium: 10.6 mg/dL — ABNORMAL HIGH (ref 8.4–10.5)
Creatinine, Ser: 0.72 mg/dL (ref 0.50–1.10)
GFR calc non Af Amer: 87 mL/min — ABNORMAL LOW (ref >90)

## 2012-09-22 LAB — SURGICAL PCR SCREEN
MRSA, PCR: NEGATIVE
Staphylococcus aureus: NEGATIVE

## 2012-09-22 LAB — PROTIME-INR: INR: 1 (ref 0.00–1.49)

## 2012-09-22 LAB — URINE MICROSCOPIC-ADD ON

## 2012-09-22 NOTE — Progress Notes (Signed)
Anesthesia Chart Review:  Patient is a 67 year old female scheduled for left carotid endarterectomy on 09/24/12 by Dr. Hart Rochester.  History includes nonsmoker, paroxysmal atrial fibrillation, hypertension, hypothyroidism (normal TSH on 09/08/12), appendectomy. She was recently found to have greater than 95% left ICA stenosis and moderate right ICA stenosis following evaluation for amaurosis fugax.  PCP is Dr. Elvina Sidle.  Cardiologist is Dr. Swaziland.  Jenn[facility] from [facility] reported that Dr. Hart Rochester has already spoken with Dr. Swaziland about upcoming surgery and no further cardiac work-up was recommended prior to surgery.    EKG at [facility] on 08/13/12 showed NSR, right superior axis deviation, RVH, non-specific T wave abnormality.  Echo on 08/27/10 showed: Left ventricle: There was mild concentric hypertrophy. Systolic function was vigorous. The estimated ejection fraction was in the range of 65% to 70%. Doppler parameters are consistent with abnormal left ventricular relaxation (grade 1 diastolic dysfunction). Trivial MR/TR.  Preoperative CXR and labs noted.    Anticipate that she can proceed as planned.  [name] [facility]/Anesthesiology Phone [phone] 09/23/2012 9:36 AM

## 2012-09-22 NOTE — Pre-Procedure Instructions (Addendum)
Ashley Berg  09/22/2012   Your procedure is scheduled on:  Thursday, July 3rd.  Report to [facility] at 5:30AM.             Enter through the Main Entrance, take [place] Elevators to 3rd Floor- Short Stay.  Call this number if you have problems the morning of surgery: [phone]   Remember:   Do not eat food or drink liquids after midnight.   Take these medicines the morning of surgery with A SIP OF WATER: DIGOX 250 levothyroxine (SYNTHROID, LEVOTHROID.  ,    Do not wear jewelry, make-up or nail polish.  Do not wear lotions, powders, or perfumes.   Do not shave 48 hours prior to surgery.   Do not bring valuables to the hospital.  [facility] is not responsible for any belongings or valuables.  Contacts, dentures or bridgework may not be worn into surgery.  Leave suitcase in the car. After surgery it may be brought to your room.  For patients admitted to the hospital, checkout time is 11:00 AM the day of discharge.   Patients discharged the day of surgery will not be allowed to drive home.  Name and phone number of your driver: -   Special Instructions: Shower using CHG 2 nights before surgery and the night before surgery.  If you shower the day of surgery use CHG.  Use special wash - you have one bottle of CHG for all showers.  You should use approximately 1/3 of the bottle for each shower.   Please read over the following fact sheets that you were given: Pain Booklet, Coughing and Deep Breathing and Surgical Site Infection Prevention

## 2012-09-22 NOTE — Progress Notes (Signed)
Subjective:     Patient ID: Ashley Berg, female   DOB: 02/24/1946, 66 y.o.   MRN: [id number]  HPI this 66-year-old female was referred by Dr. Peter Jordan for evaluation of severe carotid occlusive disease. She has no history of stroke. She denies any lateralizing weakness, aphasia, syncope, or previous neurologic symptoms. She does state that she has some blurred vision in the left eye which is not totally cause a blackout of vision but does distort her vision and is very transient. This has happened multiple times over the past 2 months. She does have a family history of stroke. She has a history of atrial fibrillation and is on digoxin  Past Medical History  Diagnosis Date  . PAF (paroxysmal atrial fibrillation)   . Hypothyroidism     Refuses conventional medicine  . Hypertension     History  Substance Use Topics  . Smoking status: Never Smoker   . Smokeless tobacco: Never Used  . Alcohol Use: No    Family History  Problem Relation Age of Onset  . Stroke Father   . Alzheimer's disease Mother     Allergies  Allergen Reactions  . Penicillins     Current outpatient prescriptions:aspirin EC 81 MG tablet, Take 1 tablet (81 mg total) by mouth daily., Disp: 30 tablet, Rfl: 11;  atorvastatin (LIPITOR) 40 MG tablet, Take 1 tablet (40 mg total) by mouth daily., Disp: 30 tablet, Rfl: 6;  b complex vitamins tablet, Take 1 tablet by mouth daily.  , Disp: , Rfl: ;  Calcium-Magnesium-Vitamin D (CALCIUM MAGNESIUM PO), Take by mouth. Pt takes suspension 2 tbsp, Disp: , Rfl:  DIGOX 250 MCG tablet, TAKE 1 TABLET EVERY DAY, Disp: 90 tablet, Rfl: 3;  Flaxseed, Linseed, (FLAX SEED OIL) 1000 MG CAPS, Take 1,000 mg by mouth 1 day or 1 dose. , Disp: , Rfl: ;  levothyroxine (SYNTHROID, LEVOTHROID) 100 MCG tablet, Take 1 tablet (100 mcg total) by mouth daily. Take 1/2 tablet daily, Disp: 90 tablet, Rfl: 2;  Olmesartan-Amlodipine-HCTZ (TRIBENZOR) 20-5-12.5 MG TABS, Take 12.5 mg by mouth daily., Disp: 90  tablet, Rfl: 3 vitamin E 400 UNIT capsule, Take 400 Units by mouth every other day.  , Disp: , Rfl: ;  Misc Natural Products (OSTEO BI-FLEX ADV TRIPLE ST PO), Take 1,500 mg by mouth 1 dose over 24 hours.  , Disp: , Rfl: ;  [DISCONTINUED] hydrochlorothiazide (MICROZIDE) 12.5 MG capsule, Take 1 capsule (12.5 mg total) by mouth daily., Disp: 30 capsule, Rfl: 5  BP 115/72  Pulse 64  Resp 16  Ht 5' 10.5" (1.791 m)  Wt 149 lb (67.586 kg)  BMI 21.07 kg/m2  SpO2 100%  Body mass index is 21.07 kg/(m^2).         Review of Systems denies chest pain, dyspnea on exertion, PND, orthopnea. Does have occasional palpitations. Legs tire easily with ambulation. Denies hemoptysis.  All other systems negative complete review of systems     Objective:   Physical Exam blood pressure 115/72 heart rate 64 respirations 16 Gen.-alert and oriented x3 in no apparent distress HEENT normal for age Lungs no rhonchi or wheezing Cardiovascular regular rhythm no murmurs carotid pulses 3+ palpable no bruits audible Abdomen soft nontender no palpable masses Musculoskeletal free of  major deformities Skin clear -no rashes Neurologic normal Lower extremities 3+ femoral and dorsalis pedis pulses palpable bilaterally with no edema  Today I reviewed the carotid ultrasound performed at [facility] which reveals a very tight left ICA stenosis-greater   than 95% and a moderate right ICA stenosis-approximately 60%     Assessment:     Symptomatic severe left ICA stenosis-greater than 95% with amaurosis fugax History of paroxysmal atrial fibrillation on digoxin    Plan:     Plan left carotid endarterectomy on Thursday, July 3-risks and benefits of intraoperative or perioperative stroke thoroughly discussed the patient would like to proceed      

## 2012-09-23 MED ORDER — VANCOMYCIN HCL 10 G IV SOLR
1500.0000 mg | INTRAVENOUS | Status: AC
  Administered 2012-09-24: 1500 mg via INTRAVENOUS
  Filled 2012-09-23: qty 1500

## 2012-09-24 ENCOUNTER — Encounter (HOSPITAL_COMMUNITY): Payer: Self-pay | Admitting: Vascular Surgery

## 2012-09-24 ENCOUNTER — Encounter (HOSPITAL_COMMUNITY)
Admission: RE | Disposition: A | Discharge status: Home / Self Care | Payer: Self-pay | Source: Ambulatory Visit | Attending: Vascular Surgery

## 2012-09-24 ENCOUNTER — Inpatient Hospital Stay (HOSPITAL_COMMUNITY)
Admission: RE | Admit: 2012-09-24 | Discharge: 2012-09-25 | DRG: 038 | Disposition: A | Discharge status: Home / Self Care | Payer: MEDICARE | Source: Ambulatory Visit | Attending: Vascular Surgery | Admitting: Vascular Surgery

## 2012-09-24 ENCOUNTER — Inpatient Hospital Stay (HOSPITAL_COMMUNITY): Payer: MEDICARE | Admitting: Anesthesiology

## 2012-09-24 ENCOUNTER — Telehealth: Payer: Self-pay | Admitting: Vascular Surgery

## 2012-09-24 ENCOUNTER — Encounter (HOSPITAL_COMMUNITY): Payer: Self-pay | Admitting: *Deleted

## 2012-09-24 DIAGNOSIS — E039 Hypothyroidism, unspecified: Secondary | ICD-10-CM | POA: Diagnosis present

## 2012-09-24 DIAGNOSIS — Z7982 Long term (current) use of aspirin: Secondary | ICD-10-CM

## 2012-09-24 DIAGNOSIS — Z823 Family history of stroke: Secondary | ICD-10-CM

## 2012-09-24 DIAGNOSIS — I4891 Unspecified atrial fibrillation: Secondary | ICD-10-CM | POA: Diagnosis present

## 2012-09-24 DIAGNOSIS — Z880 Allergy status to penicillin: Secondary | ICD-10-CM

## 2012-09-24 DIAGNOSIS — I6529 Occlusion and stenosis of unspecified carotid artery: Principal | ICD-10-CM | POA: Diagnosis present

## 2012-09-24 DIAGNOSIS — H3400 Transient retinal artery occlusion, unspecified eye: Secondary | ICD-10-CM | POA: Diagnosis present

## 2012-09-24 DIAGNOSIS — Z79899 Other long term (current) drug therapy: Secondary | ICD-10-CM

## 2012-09-24 DIAGNOSIS — I10 Essential (primary) hypertension: Secondary | ICD-10-CM | POA: Diagnosis present

## 2012-09-24 HISTORY — PX: ENDARTERECTOMY: SHX5162

## 2012-09-24 HISTORY — PX: PATCH ANGIOPLASTY: SHX6230

## 2012-09-24 SURGERY — ENDARTERECTOMY, CAROTID
Anesthesia: General | Site: Neck | Laterality: Left | Wound class: Clean

## 2012-09-24 MED ORDER — EPHEDRINE SULFATE 50 MG/ML IJ SOLN
INTRAMUSCULAR | Status: DC | PRN
  Administered 2012-09-24: 20 mg via INTRAVENOUS

## 2012-09-24 MED ORDER — DOCUSATE SODIUM 100 MG PO CAPS
100.0000 mg | ORAL_CAPSULE | Freq: Every day | ORAL | Status: DC
  Administered 2012-09-25: 100 mg via ORAL
  Filled 2012-09-24: qty 1

## 2012-09-24 MED ORDER — OXYCODONE HCL 5 MG PO TABS: 5.0000 mg | ORAL_TABLET | Freq: Four times a day (QID) | ORAL | Status: DC | PRN

## 2012-09-24 MED ORDER — PROMETHAZINE HCL 25 MG/ML IJ SOLN: 6.2500 mg | INTRAMUSCULAR | Status: DC | PRN

## 2012-09-24 MED ORDER — LABETALOL HCL 5 MG/ML IV SOLN: 10.0000 mg | INTRAVENOUS | Status: DC | PRN

## 2012-09-24 MED ORDER — PANTOPRAZOLE SODIUM 40 MG PO TBEC
40.0000 mg | DELAYED_RELEASE_TABLET | Freq: Every day | ORAL | Status: DC
  Administered 2012-09-25: 40 mg via ORAL
  Filled 2012-09-24: qty 1

## 2012-09-24 MED ORDER — ROCURONIUM BROMIDE 100 MG/10ML IV SOLN
INTRAVENOUS | Status: DC | PRN
  Administered 2012-09-24: 40 mg via INTRAVENOUS
  Administered 2012-09-24: 10 mg via INTRAVENOUS

## 2012-09-24 MED ORDER — OLMESARTAN-AMLODIPINE-HCTZ 20-5-12.5 MG PO TABS: 1.0000 | ORAL_TABLET | Freq: Every day | ORAL | Status: DC

## 2012-09-24 MED ORDER — HYDRALAZINE HCL 20 MG/ML IJ SOLN: 10.0000 mg | INTRAMUSCULAR | Status: DC | PRN

## 2012-09-24 MED ORDER — LIDOCAINE HCL 4 % MT SOLN
OROMUCOSAL | Status: DC | PRN
  Administered 2012-09-24: 4 mL via TOPICAL

## 2012-09-24 MED ORDER — MORPHINE SULFATE 2 MG/ML IJ SOLN: 1.0000 mg | INTRAMUSCULAR | Status: DC | PRN

## 2012-09-24 MED ORDER — ARTIFICIAL TEARS OP OINT
TOPICAL_OINTMENT | OPHTHALMIC | Status: DC | PRN
  Administered 2012-09-24: 1 via OPHTHALMIC

## 2012-09-24 MED ORDER — OXYCODONE HCL 5 MG/5ML PO SOLN: 5.0000 mg | Freq: Once | ORAL | Status: DC | PRN

## 2012-09-24 MED ORDER — SODIUM CHLORIDE 0.9 % IV SOLN: 500.0000 mL | Freq: Once | INTRAVENOUS | Status: AC | PRN

## 2012-09-24 MED ORDER — FENTANYL CITRATE 0.05 MG/ML IJ SOLN
INTRAMUSCULAR | Status: DC | PRN
  Administered 2012-09-24 (×3): 50 ug via INTRAVENOUS

## 2012-09-24 MED ORDER — POLYVINYL ALCOHOL 1.4 % OP SOLN
1.0000 [drp] | OPHTHALMIC | Status: DC | PRN
  Filled 2012-09-24: qty 15

## 2012-09-24 MED ORDER — BISACODYL 10 MG RE SUPP: 10.0000 mg | Freq: Every day | RECTAL | Status: DC | PRN

## 2012-09-24 MED ORDER — HYDROCHLOROTHIAZIDE 12.5 MG PO CAPS
12.5000 mg | ORAL_CAPSULE | Freq: Every day | ORAL | Status: DC
  Administered 2012-09-25: 12.5 mg via ORAL
  Filled 2012-09-24: qty 1

## 2012-09-24 MED ORDER — NEOSTIGMINE METHYLSULFATE 1 MG/ML IJ SOLN
INTRAMUSCULAR | Status: DC | PRN
  Administered 2012-09-24: 3 mg via INTRAVENOUS

## 2012-09-24 MED ORDER — VANCOMYCIN HCL IN DEXTROSE 750-5 MG/150ML-% IV SOLN
750.0000 mg | Freq: Two times a day (BID) | INTRAVENOUS | Status: DC
  Administered 2012-09-24 – 2012-09-25 (×2): 750 mg via INTRAVENOUS
  Filled 2012-09-24 (×3): qty 150

## 2012-09-24 MED ORDER — HEPARIN SODIUM (PORCINE) 1000 UNIT/ML IJ SOLN
INTRAMUSCULAR | Status: DC | PRN
  Administered 2012-09-24: 6000 [IU] via INTRAVENOUS

## 2012-09-24 MED ORDER — ALUM & MAG HYDROXIDE-SIMETH 200-200-20 MG/5ML PO SUSP: 15.0000 mL | ORAL | Status: DC | PRN

## 2012-09-24 MED ORDER — ACETAMINOPHEN 325 MG PO TABS
325.0000 mg | ORAL_TABLET | ORAL | Status: DC | PRN
  Administered 2012-09-24: 325 mg via ORAL
  Filled 2012-09-24: qty 2

## 2012-09-24 MED ORDER — DOPAMINE-DEXTROSE 3.2-5 MG/ML-% IV SOLN: 3.0000 ug/kg/min | INTRAVENOUS | Status: DC

## 2012-09-24 MED ORDER — LIDOCAINE HCL (PF) 1 % IJ SOLN
INTRAMUSCULAR | Status: AC
  Filled 2012-09-24: qty 30

## 2012-09-24 MED ORDER — PHENOL 1.4 % MT LIQD
1.0000 | OROMUCOSAL | Status: DC | PRN
  Administered 2012-09-24: 1 via OROMUCOSAL
  Filled 2012-09-24: qty 177

## 2012-09-24 MED ORDER — IRBESARTAN 150 MG PO TABS
150.0000 mg | ORAL_TABLET | Freq: Every day | ORAL | Status: DC
  Administered 2012-09-25: 150 mg via ORAL
  Filled 2012-09-24: qty 1

## 2012-09-24 MED ORDER — SODIUM CHLORIDE 0.9 % IR SOLN
Status: DC | PRN
  Administered 2012-09-24: 08:00:00

## 2012-09-24 MED ORDER — PHENYLEPHRINE HCL 10 MG/ML IJ SOLN
10.0000 mg | INTRAVENOUS | Status: DC | PRN
  Administered 2012-09-24: 15 ug/min via INTRAVENOUS

## 2012-09-24 MED ORDER — DIGOXIN 250 MCG PO TABS
0.2500 mg | ORAL_TABLET | Freq: Every day | ORAL | Status: DC
Start: 2012-09-25 — End: 2012-09-25
  Administered 2012-09-25: 0.25 mg via ORAL
  Filled 2012-09-24: qty 1

## 2012-09-24 MED ORDER — MAGNESIUM SULFATE 40 MG/ML IJ SOLN
2.0000 g | Freq: Once | INTRAMUSCULAR | Status: AC | PRN
  Filled 2012-09-24: qty 50

## 2012-09-24 MED ORDER — ASPIRIN EC 81 MG PO TBEC
81.0000 mg | DELAYED_RELEASE_TABLET | Freq: Every day | ORAL | Status: DC
  Administered 2012-09-25: 81 mg via ORAL
  Filled 2012-09-24: qty 1

## 2012-09-24 MED ORDER — SODIUM CHLORIDE 0.9 % IV SOLN: INTRAVENOUS | Status: DC

## 2012-09-24 MED ORDER — HYDROMORPHONE HCL PF 1 MG/ML IJ SOLN
INTRAMUSCULAR | Status: AC
  Filled 2012-09-24: qty 1

## 2012-09-24 MED ORDER — ACETAMINOPHEN 650 MG RE SUPP: 325.0000 mg | RECTAL | Status: DC | PRN

## 2012-09-24 MED ORDER — VITAMIN E 180 MG (400 UNIT) PO CAPS
400.0000 [IU] | ORAL_CAPSULE | ORAL | Status: DC
  Administered 2012-09-25: 400 [IU] via ORAL
  Filled 2012-09-24: qty 1

## 2012-09-24 MED ORDER — GUAIFENESIN-DM 100-10 MG/5ML PO SYRP: 15.0000 mL | ORAL_SOLUTION | ORAL | Status: DC | PRN

## 2012-09-24 MED ORDER — 0.9 % SODIUM CHLORIDE (POUR BTL) OPTIME
TOPICAL | Status: DC | PRN
  Administered 2012-09-24: 2000 mL

## 2012-09-24 MED ORDER — SODIUM CHLORIDE 0.9 % IV SOLN
INTRAVENOUS | Status: DC
  Administered 2012-09-24: 20:00:00 via INTRAVENOUS

## 2012-09-24 MED ORDER — LACTATED RINGERS IV SOLN
INTRAVENOUS | Status: DC | PRN
  Administered 2012-09-24: 07:00:00 via INTRAVENOUS

## 2012-09-24 MED ORDER — HYDROMORPHONE HCL PF 1 MG/ML IJ SOLN
0.2500 mg | INTRAMUSCULAR | Status: DC | PRN
  Administered 2012-09-24: 0.25 mg via INTRAVENOUS

## 2012-09-24 MED ORDER — GLYCOPYRROLATE 0.2 MG/ML IJ SOLN
INTRAMUSCULAR | Status: DC | PRN
  Administered 2012-09-24 (×2): 0.1 mg via INTRAVENOUS
  Administered 2012-09-24: 0.4 mg via INTRAVENOUS

## 2012-09-24 MED ORDER — PROTAMINE SULFATE 10 MG/ML IV SOLN
INTRAVENOUS | Status: DC | PRN
  Administered 2012-09-24: 20 mg via INTRAVENOUS
  Administered 2012-09-24: 10 mg via INTRAVENOUS
  Administered 2012-09-24: 20 mg via INTRAVENOUS

## 2012-09-24 MED ORDER — ONDANSETRON HCL 4 MG/2ML IJ SOLN
INTRAMUSCULAR | Status: DC | PRN
  Administered 2012-09-24: 4 mg via INTRAVENOUS

## 2012-09-24 MED ORDER — LIDOCAINE HCL (CARDIAC) 20 MG/ML IV SOLN
INTRAVENOUS | Status: DC | PRN
  Administered 2012-09-24: 90 mg via INTRAVENOUS

## 2012-09-24 MED ORDER — PROPOFOL 10 MG/ML IV BOLUS
INTRAVENOUS | Status: DC | PRN
  Administered 2012-09-24: 140 mg via INTRAVENOUS

## 2012-09-24 MED ORDER — PHENYLEPHRINE HCL 10 MG/ML IJ SOLN
INTRAMUSCULAR | Status: DC | PRN
  Administered 2012-09-24: 80 ug via INTRAVENOUS
  Administered 2012-09-24: 120 ug via INTRAVENOUS

## 2012-09-24 MED ORDER — METOPROLOL TARTRATE 1 MG/ML IV SOLN: 2.0000 mg | INTRAVENOUS | Status: DC | PRN

## 2012-09-24 MED ORDER — LEVOTHYROXINE SODIUM 50 MCG PO TABS
50.0000 ug | ORAL_TABLET | Freq: Every day | ORAL | Status: DC
  Administered 2012-09-25: 50 ug via ORAL
  Filled 2012-09-24 (×2): qty 1

## 2012-09-24 MED ORDER — SENNOSIDES-DOCUSATE SODIUM 8.6-50 MG PO TABS
1.0000 | ORAL_TABLET | Freq: Every evening | ORAL | Status: DC | PRN
  Filled 2012-09-24: qty 1

## 2012-09-24 MED ORDER — ONDANSETRON HCL 4 MG/2ML IJ SOLN: 4.0000 mg | Freq: Four times a day (QID) | INTRAMUSCULAR | Status: DC | PRN

## 2012-09-24 MED ORDER — VANCOMYCIN HCL IN DEXTROSE 1-5 GM/200ML-% IV SOLN: 1000.0000 mg | Freq: Two times a day (BID) | INTRAVENOUS | Status: DC

## 2012-09-24 MED ORDER — POTASSIUM CHLORIDE CRYS ER 20 MEQ PO TBCR: 20.0000 meq | EXTENDED_RELEASE_TABLET | Freq: Once | ORAL | Status: AC | PRN

## 2012-09-24 MED ORDER — SIMILASAN DRY EYE RELIEF OP SOLN: 1.0000 [drp] | OPHTHALMIC | Status: DC | PRN

## 2012-09-24 MED ORDER — OXYCODONE HCL 5 MG PO TABS: 5.0000 mg | ORAL_TABLET | Freq: Once | ORAL | Status: DC | PRN

## 2012-09-24 SURGICAL SUPPLY — 46 items
CANISTER SUCTION 2500CC (MISCELLANEOUS) ×2 IMPLANT
CATH ROBINSON RED A/P 18FR (CATHETERS) ×2 IMPLANT
CATH SUCT 10FR WHISTLE TIP (CATHETERS) ×2 IMPLANT
CLIP TI MEDIUM 24 (CLIP) ×2 IMPLANT
CLIP TI WIDE RED SMALL 24 (CLIP) ×2 IMPLANT
CLOTH BEACON ORANGE TIMEOUT ST (SAFETY) ×2 IMPLANT
COVER SURGICAL LIGHT HANDLE (MISCELLANEOUS) ×2 IMPLANT
CRADLE DONUT ADULT HEAD (MISCELLANEOUS) ×2 IMPLANT
DECANTER SPIKE VIAL GLASS SM (MISCELLANEOUS) IMPLANT
DRAIN HEMOVAC 1/8 X 5 (WOUND CARE) IMPLANT
DRAPE WARM FLUID 44X44 (DRAPE) ×2 IMPLANT
DRSG COVADERM 4X8 (GAUZE/BANDAGES/DRESSINGS) ×1 IMPLANT
ELECT REM PT RETURN 9FT ADLT (ELECTROSURGICAL) ×2
ELECTRODE REM PT RTRN 9FT ADLT (ELECTROSURGICAL) ×1 IMPLANT
EVACUATOR SILICONE 100CC (DRAIN) IMPLANT
GLOVE BIO SURGEON STRL SZ7.5 (GLOVE) ×1 IMPLANT
GLOVE BIOGEL PI IND STRL 6.5 (GLOVE) IMPLANT
GLOVE BIOGEL PI IND STRL 7.0 (GLOVE) IMPLANT
GLOVE BIOGEL PI IND STRL 7.5 (GLOVE) IMPLANT
GLOVE BIOGEL PI INDICATOR 6.5 (GLOVE) ×2
GLOVE BIOGEL PI INDICATOR 7.0 (GLOVE) ×1
GLOVE BIOGEL PI INDICATOR 7.5 (GLOVE) ×1
GLOVE ECLIPSE 6.5 STRL STRAW (GLOVE) ×1 IMPLANT
GLOVE SS BIOGEL STRL SZ 7 (GLOVE) ×1 IMPLANT
GLOVE SUPERSENSE BIOGEL SZ 7 (GLOVE) ×2
GOWN STRL NON-REIN LRG LVL3 (GOWN DISPOSABLE) ×4 IMPLANT
INSERT FOGARTY SM (MISCELLANEOUS) ×2 IMPLANT
KIT BASIN OR (CUSTOM PROCEDURE TRAY) ×2 IMPLANT
KIT ROOM TURNOVER OR (KITS) ×2 IMPLANT
NEEDLE 22X1 1/2 (OR ONLY) (NEEDLE) IMPLANT
NS IRRIG 1000ML POUR BTL (IV SOLUTION) ×4 IMPLANT
PACK CAROTID (CUSTOM PROCEDURE TRAY) ×2 IMPLANT
PAD ARMBOARD 7.5X6 YLW CONV (MISCELLANEOUS) ×4 IMPLANT
PATCH HEMASHIELD 8X75 (Vascular Products) ×1 IMPLANT
SHUNT CAROTID BYPASS 12FRX15.5 (VASCULAR PRODUCTS) IMPLANT
SUT PROLENE 6 0 CC (SUTURE) ×2 IMPLANT
SUT SILK 2 0 FS (SUTURE) ×2 IMPLANT
SUT SILK 3 0 TIES 17X18 (SUTURE)
SUT SILK 3-0 18XBRD TIE BLK (SUTURE) IMPLANT
SUT VIC AB 2-0 CT1 27 (SUTURE) ×2
SUT VIC AB 2-0 CT1 TAPERPNT 27 (SUTURE) ×1 IMPLANT
SUT VIC AB 3-0 X1 27 (SUTURE) ×2 IMPLANT
SYR CONTROL 10ML LL (SYRINGE) IMPLANT
TOWEL OR 17X24 6PK STRL BLUE (TOWEL DISPOSABLE) ×2 IMPLANT
TOWEL OR 17X26 10 PK STRL BLUE (TOWEL DISPOSABLE) ×2 IMPLANT
WATER STERILE IRR 1000ML POUR (IV SOLUTION) ×2 IMPLANT

## 2012-09-24 NOTE — Op Note (Signed)
OPERATIVE REPORT  Date of Surgery: 09/24/2012  Surgeon: Josephina Gip, MD  Assistant: Lianne Cure PA  Pre-op Diagnosis: Very severe left ICA stenosis with amaurosis fugax left eye Post-op Diagnosis: Same Procedure: Procedure(s): ENDARTERECTOMY CAROTID--Left with Dacron patch angioplasty   Anesthesia: General  EBL: 50 cc  Complications: None  Procedure Details: The patient was taken to the operating room and placed in the supine position. Following induction of satisfactory general endotracheal anesthesia the left neck was prepped and draped in a routine sterile manner. Incision was made on the anterior border of the sternocleidomastoid muscle and carried down through the subcutaneous tissue and platysma using the Bovie. Care was taken not to injure the hypoglossal nerve.. The common internal and external carotid arteries were dissected free. There was a calcified atherosclerotic plaque at the carotid bifurcation extending up the internal carotid artery. A #10 shunt was then prepared and the patient was heparinized. The carotid vessels were occluded with vascular clamps. A longitudinal opening was made in the common carotid with a 15 blade extended up the internal carotid with the Potts scissors to a point distal to the disease. The plaque was approximately 99 % stenotic in severity. The distal vessel appeared normal. Shunt was inserted without difficulty reestablishing flow in about 2 minutes. A standard endarterectomy was performed with an eversion endarterectomy of the external carotid. The plaque feathered off  the distal internal carotid artery nicely not requiring any tacking sutures. The lumen was thoroughly irrigated with heparinized saline and loose debris all carefully removed. The arterotomy was then closed with a patch using continuous 6-0 Prolene. Prior to completion of the  Closure the  shunt was removed after approximately 30 minutes of shunt time. Flow was then reestablished up  the external branch initially followed by the internal branch. Protamine was given to her reverse the heparin.Following adequate hemostasis the wound was irrigated with saline and closed in layers with Vicryl ain a subcuticular fashion. Sterile dressing was applied and the patient taken to the recovery room in stable condition.  Josephina Gip, MD 09/24/2012 9:10 AM

## 2012-09-24 NOTE — Transfer of Care (Signed)
Immediate Anesthesia Transfer of Care Note  Patient: Ashley Berg  Procedure(s) Performed: Procedure(s): ENDARTERECTOMY CAROTID (Left) PATCH ANGIOPLASTY (Left)  Patient Location: PACU  Anesthesia Type:General  Level of Consciousness: awake, alert  and oriented  Airway & Oxygen Therapy: Patient Spontanous Breathing and Patient connected to nasal cannula oxygen  Post-op Assessment: Report given to PACU RN, Post -op Vital signs reviewed and stable, Patient moving all extremities X 4 and Patient able to stick tongue midline  Post vital signs: Reviewed and stable  Complications: No apparent anesthesia complications

## 2012-09-24 NOTE — H&P (View-Only) (Signed)
Subjective:     Patient ID: Ashley Berg, female   DOB: 03/07/1946, 67 y.o.   MRN: [id number]  HPI this 67 year old female was referred by Dr. Peter Swaziland for evaluation of severe carotid occlusive disease. She has no history of stroke. She denies any lateralizing weakness, aphasia, syncope, or previous neurologic symptoms. She does state that she has some blurred vision in the left eye which is not totally cause a blackout of vision but does distort her vision and is very transient. This has happened multiple times over the past 2 months. She does have a family history of stroke. She has a history of atrial fibrillation and is on digoxin  Past Medical History  Diagnosis Date  . PAF (paroxysmal atrial fibrillation)   . Hypothyroidism     Refuses conventional medicine  . Hypertension     History  Substance Use Topics  . Smoking status: Never Smoker   . Smokeless tobacco: Never Used  . Alcohol Use: No    Family History  Problem Relation Age of Onset  . Stroke Father   . Alzheimer's disease Mother     Allergies  Allergen Reactions  . Penicillins     Current outpatient prescriptions:aspirin EC 81 MG tablet, Take 1 tablet (81 mg total) by mouth daily., Disp: 30 tablet, Rfl: 11;  atorvastatin (LIPITOR) 40 MG tablet, Take 1 tablet (40 mg total) by mouth daily., Disp: 30 tablet, Rfl: 6;  b complex vitamins tablet, Take 1 tablet by mouth daily.  , Disp: , Rfl: ;  Calcium-Magnesium-Vitamin D (CALCIUM MAGNESIUM PO), Take by mouth. Pt takes suspension 2 tbsp, Disp: , Rfl:  DIGOX 250 MCG tablet, TAKE 1 TABLET EVERY DAY, Disp: 90 tablet, Rfl: 3;  Flaxseed, Linseed, (FLAX SEED OIL) 1000 MG CAPS, Take 1,000 mg by mouth 1 day or 1 dose. , Disp: , Rfl: ;  levothyroxine (SYNTHROID, LEVOTHROID) 100 MCG tablet, Take 1 tablet (100 mcg total) by mouth daily. Take 1/2 tablet daily, Disp: 90 tablet, Rfl: 2;  Olmesartan-Amlodipine-HCTZ (TRIBENZOR) 20-5-12.5 MG TABS, Take 12.5 mg by mouth daily., Disp: 90  tablet, Rfl: 3 vitamin E 400 UNIT capsule, Take 400 Units by mouth every other day.  , Disp: , Rfl: ;  Misc Natural Products (OSTEO BI-FLEX ADV TRIPLE ST PO), Take 1,500 mg by mouth 1 dose over 24 hours.  , Disp: , Rfl: ;  [DISCONTINUED] hydrochlorothiazide (MICROZIDE) 12.5 MG capsule, Take 1 capsule (12.5 mg total) by mouth daily., Disp: 30 capsule, Rfl: 5  BP 115/72  Pulse 64  Resp 16  Ht 5' 10.5" (1.791 m)  Wt 149 lb (67.586 kg)  BMI 21.07 kg/m2  SpO2 100%  Body mass index is 21.07 kg/(m^2).         Review of Systems denies chest pain, dyspnea on exertion, PND, orthopnea. Does have occasional palpitations. Legs tire easily with ambulation. Denies hemoptysis.  All other systems negative complete review of systems     Objective:   Physical Exam blood pressure 115/72 heart rate 64 respirations 16 Gen.-alert and oriented x3 in no apparent distress HEENT normal for age Lungs no rhonchi or wheezing Cardiovascular regular rhythm no murmurs carotid pulses 3+ palpable no bruits audible Abdomen soft nontender no palpable masses Musculoskeletal free of  major deformities Skin clear -no rashes Neurologic normal Lower extremities 3+ femoral and dorsalis pedis pulses palpable bilaterally with no edema  Today I reviewed the carotid ultrasound performed at [facility] which reveals a very tight left ICA stenosis-greater  than 95% and a moderate right ICA stenosis-approximately 60%     Assessment:     Symptomatic severe left ICA stenosis-greater than 95% with amaurosis fugax History of paroxysmal atrial fibrillation on digoxin    Plan:     Plan left carotid endarterectomy on Thursday, July 3-risks and benefits of intraoperative or perioperative stroke thoroughly discussed the patient would like to proceed

## 2012-09-24 NOTE — Progress Notes (Signed)
Patient received from PACU, alert and oriented. Vitals stable. Son is at the bedside. Started on ice chips. Instructed patient to call when she needs to use the bathroom.

## 2012-09-24 NOTE — Discharge Summary (Signed)
[facility] Discharge Summary   Patient ID:  Ashley Berg MRN: [id number] DOB/AGE: 10-10-1945 67 y.o.  Admit date: 09/24/2012 Discharge date: 09/25/12 Date of Surgery: 09/24/2012 Surgeon: Surgeon(s): Pryor Ochoa, MD  Admission Diagnosis: Left ICA stenosis  Discharge Diagnoses:  Left ICA stenosis  Secondary Diagnoses: Past Medical History  Diagnosis Date  . PAF (paroxysmal atrial fibrillation)   . Hypothyroidism     Refuses conventional medicine  . Hypertension   . Seasonal allergies     Procedure(s): Left ENDARTERECTOMY CAROTID PATCH ANGIOPLASTY  Discharged Condition: good  HPI: 67 year old female was referred by Dr. Peter [place] for evaluation of severe carotid occlusive disease. She has no history of stroke. She denies any lateralizing weakness, aphasia, syncope, or previous neurologic symptoms. She does state that she has some blurred vision in the left eye which is not totally cause a blackout of vision but does distort her vision and is very transient. This has happened multiple times over the past 2 months. She does have a family history of stroke. She has a history of atrial fibrillation and is on digoxin She is admitted for left carotid endarterectomy Pt uses herbal medication and does not want to take lipitor - she has her cholesterol levels checked with Dr. Sw[place]iland  Hospital Course:  Ashley Berg is a 67 y.o. female is S/P Left Procedure(s): ENDARTERECTOMY CAROTID PATCH ANGIOPLASTY Extubated: POD # 0 Physical exam:  Post-op wounds healing well Pt. Ambulating, voiding and taking PO diet without difficulty. Pt pain controlled with PO pain meds. Labs as below Complications:none  Consults:     Significant Diagnostic Studies: CBC Lab Results  Component Value Date   WBC 5.9 09/22/2012   HGB 15.3* 09/22/2012   HCT 43.5 09/22/2012   MCV 94.2 09/22/2012   PLT 191 09/22/2012    BMET    Component Value Date/Time   NA 139 09/22/2012 1121   K 3.5  09/22/2012 1121   CL 100 09/22/2012 1121   CO2 29 09/22/2012 1121   GLUCOSE 119* 09/22/2012 1121   BUN 21 09/22/2012 1121   CREATININE 0.72 09/22/2012 1121   CALCIUM 10.6* 09/22/2012 1121   GFRNONAA 87* 09/22/2012 1121   GFRAA >90 09/22/2012 1121   COAG Lab Results  Component Value Date   INR 1.00 09/22/2012     Disposition:  Discharge to :Home Discharge Orders   Future Appointments Provider Department Dept Phone   12/17/2012 8:10 AM [facility] Main Office [place]) [phone]   Future Orders Complete By Expires     CAROTID Sugery: Call MD for difficulty swallowing or speaking; weakness in arms or legs that is a new symtom; severe headache.  If you have increased swelling in the neck and/or  are having difficulty breathing, CALL 911  As directed     Call MD for:  redness, tenderness, or signs of infection (pain, swelling, bleeding, redness, odor or green/yellow discharge around incision site)  As directed     Call MD for:  severe or increased pain, loss or decreased feeling  in affected limb(s)  As directed     Call MD for:  temperature >100.5  As directed     Discharge wound care:  As directed     Comments:      Shower daily with soap and water starting 09/26/12    Driving Restrictions  As directed     Comments:      No driving for 2 weeks    Lifting  restrictions  As directed     Comments:      No lifting for 4 weeks    Nursing communication  As directed     Scheduling Instructions:      Please give paper Rx to patient at discharge.  It is on the paper chart.    Resume previous diet  As directed         Medication List         aspirin EC 81 MG tablet  Take 1 tablet (81 mg total) by mouth daily.     b complex vitamins tablet  Take 1 tablet by mouth daily.     CALCIUM MAGNESIUM PO  Take 30 mLs by mouth daily. Oral suspension     digoxin 0.25 MG tablet  Commonly known as:  LANOXIN  Take 0.25 mg by mouth daily.     Flax Seed Oil 1000 MG Caps  Take 1,000 mg by  mouth every other day.     Glucosamine Complex Tabs  Take 1 tablet by mouth daily.     levothyroxine 100 MCG tablet  Commonly known as:  SYNTHROID, LEVOTHROID  Take 50 mcg by mouth daily before breakfast.     OVER THE COUNTER MEDICATION  Take 1 tablet by mouth daily. Natural thyroid support     OVER THE COUNTER MEDICATION  Take 1 capsule by mouth daily. polycosanol natural supplement     oxyCODONE 5 MG immediate release tablet  Commonly known as:  ROXICODONE  Take 1 tablet (5 mg total) by mouth every 6 (six) hours as needed for pain.     RED YEAST RICE PO  Take 1 capsule by mouth daily.     Similasan Dry Eye Relief Soln  Place 1 drop into both eyes as needed (dry eyes).     TRIBENZOR 20-5-12.5 MG Tabs  Generic drug:  Olmesartan-Amlodipine-HCTZ  Take 1 tablet by mouth daily.     vitamin E 400 UNIT capsule  Take 400 Units by mouth every other day.       Verbal and written Discharge instructions given to the patient. Wound care per Discharge AVS     Follow-up Information   Follow up with Josephina Gip, MD In 2 weeks. (Office will call you to arrange your appt (sent))    Contact information:   [address] [place] [address] [phone]       Signed: Clinton Gallant [facility] 09/24/2012, 1:29 PM  --- For [facility] Registry use --- Instructions: Press F2 to tab through selections.  Delete question if not applicable.   Modified Rankin score at D/C (0-6): Rankin Score=1  IV medication needed for:  1. Hypertension: No 2. Hypotension: No  Post-op Complications: No  1. Post-op CVA or TIA: No  If yes: Event classification (right eye, left eye, right cortical, left cortical, verterobasilar, other):   If yes: Timing of event (intra-op, <6 hrs post-op, >=6 hrs post-op, unknown):   2. CN injury: No    3. Myocardial infarction: No  If yes: Dx by (EKG or clinical, Troponin):   4.  CHF: No  5.  Dysrhythmia (new): No  6. Wound infection: No  7. Reperfusion  symptoms: Yes - mild H/A  8. Return to OR: No  If yes: return to OR for (bleeding, neurologic, other CEA incision, other):   Discharge medications: Statin use:  Yes  Pt uses herbal medication and does not want to take lipitor ASA use:  Yes Beta blocker use:  Yes ACE-Inhibitor  use:  no P2Y12 Antagonist use: [x ] None, [ ]  Plavix, [ ]  Plasugrel, [ ]  Ticlopinine, [ ]  Ticagrelor, [ ]  Other, [ ]  No for medical reason, [ ]  Non-compliant, [ ]  Not-indicated Anti-coagulant use:  [x ] None, [ ]  Warfarin, [ ]  Rivaroxaban, [ ]  Dabigatran, [ ]  Other, [ ]  No for medical reason, [ ]  Non-compliant, [ ]  Not-indicated

## 2012-09-24 NOTE — Telephone Encounter (Addendum)
Message copied by Rosalyn Charters on Thu Sep 24, 2012  3:19 PM ------      Message from: Lorin Mercy K      Created: Thu Sep 24, 2012  1:48 PM      Regarding: schedule                   ----- Message -----         From: Dara Lords, PA-C         Sent: 09/24/2012  10:49 AM           To: Sharee Pimple, CMA            S/p left CEA 09/24/12.  F/u with Dr. Hart Rochester in 2 weeks.            Thanks,      Lelon Mast ----  --notified patient of fu appt. with dr. early on 10-20-12 8:30

## 2012-09-24 NOTE — Preoperative (Signed)
Beta Blockers   Reason not to administer Beta Blockers:Not Applicable 

## 2012-09-24 NOTE — Progress Notes (Signed)
ANTIBIOTIC CONSULT NOTE - INITIAL  Pharmacy Consult:  Vancomycin Indication:   Surgical prophylaxis  Allergies  Allergen Reactions  . Broccoli (Brassica Oleracea Italica)     GI upset  . Chicken Protein     Does not eat meat  . Milk-Related Compounds     GI upset  . Mushroom Extract Complex     GI upset  . Onion     GI upset  . Penicillins Other (See Comments)    "skin crawls"    Patient Measurements: Height: 5' 10.08" (178 cm) Weight: 148 lb 13 oz (67.5 kg) IBW/kg (Calculated) : 68.68  Vital Signs: Temp: 97.5 F (36.4 C) (07/03 1115) Temp src: Oral (07/03 0546) BP: 107/49 mmHg (07/03 1135) Pulse Rate: 53 (07/03 1200) Intake/Output from this shift: Total I/O In: 800 [I.V.:800] Out: -   Labs:  Recent Labs  09/22/12 1121  WBC 5.9  HGB 15.3*  PLT 191  CREATININE 0.72   Estimated Creatinine Clearance: 72.7 ml/min (by C-G formula based on Cr of 0.72). No results found for this basename: VANCOTROUGH, Leodis Binet, VANCORANDOM, GENTTROUGH, GENTPEAK, GENTRANDOM, TOBRATROUGH, TOBRAPEAK, TOBRARND, AMIKACINPEAK, AMIKACINTROU, AMIKACIN,  in the last 72 hours   Microbiology: Recent Results (from the past 720 hour(s))  SURGICAL PCR SCREEN     Status: None   Collection Time    09/22/12 11:19 AM      Result Value Range Status   MRSA, PCR NEGATIVE  NEGATIVE Final   Staphylococcus aureus NEGATIVE  NEGATIVE Final   Comment:            The Xpert SA Assay (FDA     approved for NASAL specimens     in patients over 73 years of age),     is one component of     a comprehensive surveillance     program.  Test performance has     been validated by [employer] for patients greater     than or equal to 20 year old.     It is not intended     to diagnose infection nor to     guide or monitor treatment.    Medical History: Past Medical History  Diagnosis Date  . PAF (paroxysmal atrial fibrillation)   . Hypothyroidism     Refuses conventional medicine  . Hypertension    . Seasonal allergies        Assessment: 67 YOF s/p carotid endarterectomy and patch angioplasty to continue vancomycin for post-op prophylaxis.  Patient with stable renal function, estimated CrCL 73 ml/min.  She received vancomycin 1500mg  IV x 1 around 0700 today.   Goal of Therapy:  Infection prevention   Plan:  - Change vanc to 750mg  IV Q12H x 2 doses - Pharmacy will sign off as expect dosage adjustment unnecessary.  Thank you for the consult!     Taliyah Watrous D. Laney Potash, PharmD, BCPS Pager:  [phone] 09/24/2012, 12:57 PM

## 2012-09-24 NOTE — Anesthesia Preprocedure Evaluation (Addendum)
Anesthesia Evaluation  Patient identified by MRN, date of birth, ID band Patient awake    Reviewed: Allergy & Precautions, H&P , NPO status , Patient's Chart, lab work & pertinent test results  History of Anesthesia Complications Negative for: history of anesthetic complications  Airway Mallampati: II TM Distance: >3 FB Neck ROM: Full    Dental  (+) Teeth Intact and Dental Advisory Given   Pulmonary neg pulmonary ROS,    Pulmonary exam normal       Cardiovascular hypertension, + Peripheral Vascular Disease + dysrhythmias Atrial Fibrillation     Neuro/Psych negative neurological ROS  negative psych ROS   GI/Hepatic negative GI ROS, Neg liver ROS,   Endo/Other  Hypothyroidism   Renal/GU negative Renal ROS     Musculoskeletal   Abdominal   Peds  Hematology   Anesthesia Other Findings   Reproductive/Obstetrics                          Anesthesia Physical Anesthesia Plan  ASA: III  Anesthesia Plan: General   Post-op Pain Management:    Induction: Intravenous  Airway Management Planned: Oral ETT  Additional Equipment: Arterial line  Intra-op Plan:   Post-operative Plan: Extubation in OR  Informed Consent: I have reviewed the patients History and Physical, chart, labs and discussed the procedure including the risks, benefits and alternatives for the proposed anesthesia with the patient or authorized representative who has indicated his/her understanding and acceptance.   Dental advisory given  Plan Discussed with: CRNA, Anesthesiologist and Surgeon  Anesthesia Plan Comments:        Anesthesia Quick Evaluation

## 2012-09-24 NOTE — Anesthesia Postprocedure Evaluation (Signed)
Anesthesia Post Note  Patient: Ashley Berg  Procedure(s) Performed: Procedure(s) (LRB): ENDARTERECTOMY CAROTID (Left) PATCH ANGIOPLASTY (Left)  Anesthesia type: General  Patient location: PACU  Post pain: Pain level controlled and Adequate analgesia  Post assessment: Post-op Vital signs reviewed, Patient's Cardiovascular Status Stable, Respiratory Function Stable, Patent Airway and Pain level controlled  Last Vitals:  Filed Vitals:   09/24/12 1047  BP: 113/52  Pulse: 53  Temp:   Resp: 13    Post vital signs: Reviewed and stable  Level of consciousness: awake, alert  and oriented  Complications: No apparent anesthesia complications

## 2012-09-24 NOTE — Anesthesia Procedure Notes (Signed)
Procedure Name: Intubation Date/Time: 09/24/2012 7:30 AM Performed by: Gayla Medicus Pre-anesthesia Checklist: Patient identified, Timeout performed, Emergency Drugs available, Suction available and Patient being monitored Patient Re-evaluated:Patient Re-evaluated prior to inductionOxygen Delivery Method: Circle system utilized Preoxygenation: Pre-oxygenation with 100% oxygen Intubation Type: IV induction Ventilation: Mask ventilation without difficulty Laryngoscope Size: Mac and 3 Grade View: Grade I Tube type: Oral Tube size: 7.5 mm Number of attempts: 1 Airway Equipment and Method: Stylet and LTA kit utilized Placement Confirmation: ETT inserted through vocal cords under direct vision,  positive ETCO2 and breath sounds checked- equal and bilateral Secured at: 22 cm Tube secured with: Tape Dental Injury: Teeth and Oropharynx as per pre-operative assessment

## 2012-09-24 NOTE — Interval H&P Note (Signed)
History and Physical Interval Note:  09/24/2012 7:09 AM  Ashley Berg  has presented today for surgery, with the diagnosis of Left ICA stenosis  The various methods of treatment have been discussed with the patient and family. After consideration of risks, benefits and other options for treatment, the patient has consented to  Procedure(s): ENDARTERECTOMY CAROTID (Left) as a surgical intervention .  The patient's history has been reviewed, patient examined, no change in status, stable for surgery.  I have reviewed the patient's chart and labs.  Questions were answered to the patient's satisfaction.     Josephina Gip

## 2012-09-25 LAB — BASIC METABOLIC PANEL
BUN: 12 mg/dL (ref 6–23)
CO2: 28 mEq/L (ref 19–32)
Calcium: 8.9 mg/dL (ref 8.4–10.5)
GFR calc non Af Amer: >90 mL/min (ref >90)
Glucose, Bld: 107 mg/dL — ABNORMAL HIGH (ref 70–99)
Sodium: 140 mEq/L (ref 135–145)

## 2012-09-25 LAB — CBC
HCT: 34.8 % — ABNORMAL LOW (ref 36.0–46.0)
Hemoglobin: 11.8 g/dL — ABNORMAL LOW (ref 12.0–15.0)
MCH: 31.8 pg (ref 26.0–34.0)
MCHC: 33.9 g/dL (ref 30.0–36.0)
MCV: 93.8 fL (ref 78.0–100.0)
RBC: 3.71 MIL/uL — ABNORMAL LOW (ref 3.87–5.11)

## 2012-09-25 NOTE — Progress Notes (Signed)
VASCULAR AND VEIN SURGERY POST - OP CEA PROGRESS NOTE  Date of Surgery: 09/24/2012  Surgeon(s): Pryor Ochoa, MD 1 Day Post-Op left CEA .  HPI: Ashley Berg is a 67 y.o. female who is 1 Day Post-Op . Patient is doing well. Patient reports headache; Patient denies difficulty swallowing; denies weakness in upper or lower extremities; Pt. denies other symptoms of stroke or TIA.  IMAGING: None   Significant Diagnostic Studies: CBC Lab Results  Component Value Date   WBC 5.5 09/25/2012   HGB 11.8* 09/25/2012   HCT 34.8* 09/25/2012   MCV 93.8 09/25/2012   PLT 145* 09/25/2012    BMET    Component Value Date/Time   NA 140 09/25/2012 0500   K 3.7 09/25/2012 0500   CL 107 09/25/2012 0500   CO2 28 09/25/2012 0500   GLUCOSE 107* 09/25/2012 0500   BUN 12 09/25/2012 0500   CREATININE 0.59 09/25/2012 0500   CALCIUM 8.9 09/25/2012 0500   GFRNONAA >90 09/25/2012 0500   GFRAA >90 09/25/2012 0500    COAG Lab Results  Component Value Date   INR 1.00 09/22/2012   No results found for this basename: PTT      Intake/Output Summary (Last 24 hours) at 09/25/12 0752 Last data filed at 09/25/12 0400  Gross per 24 hour  Intake   3045 ml  Output     50 ml  Net   2995 ml    Physical Exam:  BP Readings from Last 3 Encounters:  09/25/12 135/50  09/25/12 135/50  09/22/12 160/79   Temp Readings from Last 3 Encounters:  09/25/12 98.6 F (37 C) Oral  09/25/12 98.6 F (37 C) Oral  09/22/12 98.1 F (36.7 C)    SpO2 Readings from Last 3 Encounters:  09/25/12 95%  09/25/12 95%  09/22/12 97%   Pulse Readings from Last 3 Encounters:  09/25/12 68  09/25/12 68  09/22/12 54    Pt is A&O x 3 Gait is normal Speech is fluent left Neck Wound is healing well Patient with Negative tongue deviation and Negative facial droop Pt has good and equal strength in all extremities  Assessment/Plan:: Ashley Berg is a 67 y.o. female is S/P Left CEA Pt is voiding, ambulating and taking po well  Pt uses herbal  medication to lower cholesterol and does not want to take lipitor - cholesterol followed by Dr. Sw[place]   Discharge to: Home Follow-up in 2 weeks   Dayshon Roback J  09/25/2012 7:52 AM

## 2012-09-25 NOTE — Progress Notes (Signed)
Patient has been discharged home via wheelchair. IV has been D/C. Reviewed all discharge instructions with patient and answered all questions and concerns

## 2012-09-25 NOTE — Plan of Care (Signed)
Problem: Consults Goal: Diagnosis CEA/CES/AAA Stent Outcome: Completed/Met Date Met:  09/25/12 Carotid Endarterectomy (CEA)CEA

## 2012-09-25 NOTE — Discharge Summary (Signed)
Patient to be discharged today Neurologic exam intact The left neck wound looks good  Patient to return to office in 2 weeks for followup

## 2012-09-26 LAB — TYPE AND SCREEN
ABO/RH(D): O POS
DAT, IgG: NEGATIVE
Unit division: 0

## 2012-09-28 ENCOUNTER — Encounter (HOSPITAL_COMMUNITY): Payer: Self-pay | Admitting: Vascular Surgery

## 2012-09-30 ENCOUNTER — Telehealth: Payer: Self-pay | Admitting: *Deleted

## 2012-09-30 NOTE — Telephone Encounter (Signed)
Patient called in to report some numbness at the lower portion of her incision line and under her chin; S/P Left CEA on 09-24-12 by Dr. Hart Rochester. She reports no fever, drainage or erythema. She is able to swallow, eat and drink without any difficulty or choking. I discussed with her that this numbness was normal during the postoperative period. She will call us back if she has any signs of worsening, speech or swallowing difficulty, or any sign of infection. She voiced understanding of this plan and has her postop appt date planned.

## 2012-10-08 ENCOUNTER — Telehealth: Payer: Self-pay | Admitting: Vascular Surgery

## 2012-10-08 NOTE — Telephone Encounter (Signed)
l/v/m notifying patient of change in her fu appt. 10-13-12 at 8:30 with JDL

## 2012-10-12 ENCOUNTER — Encounter: Payer: Self-pay | Admitting: Vascular Surgery

## 2012-10-13 ENCOUNTER — Encounter: Payer: MEDICARE | Admitting: Vascular Surgery

## 2012-10-13 ENCOUNTER — Encounter: Payer: Self-pay | Admitting: Vascular Surgery

## 2012-10-13 ENCOUNTER — Ambulatory Visit (INDEPENDENT_AMBULATORY_CARE_PROVIDER_SITE_OTHER): Payer: MEDICARE | Admitting: Vascular Surgery

## 2012-10-13 DIAGNOSIS — I6529 Occlusion and stenosis of unspecified carotid artery: Secondary | ICD-10-CM

## 2012-10-13 DIAGNOSIS — Z48812 Encounter for surgical aftercare following surgery on the circulatory system: Secondary | ICD-10-CM

## 2012-10-13 NOTE — Progress Notes (Signed)
Subjective:     Patient ID: [name], female   DOB: [birth year], 67 y.o.   MRN: [id number]  HPI this 67 year old female returns 2 weeks post left carotid endarterectomy for severe left ICA stenosis. She denies any neurologic symptoms or visual changes. She is swallowing well and has no hoarseness.   Review of Systems     Objective:   Physical Exam BP 136/64  Pulse 66  Temp(Src) 98.2 F (36.8 C) (Oral)  Ht 5\' 10"  (1.778 m)  Wt 151 lb 6.4 oz (68.675 kg)  BMI 21.72 kg/m2  SpO2 100%  General well-developed well-nourished female in no apparent distress alert and oriented x3 Left neck incision is healing nicely. 3+ carotid pulse. No bruit audible. Neurologic exam normal      Assessment:     Doing well 2 weeks post left carotid endarterectomy for severe left ICA stenosis with some mild visual symptoms preoperatively    Plan:     Return in 6 months with carotid duplex exam and continue daily aspirin

## 2012-10-13 NOTE — Addendum Note (Signed)
Addended by: Adria Dill L on: 10/13/2012 12:03 PM   Modules accepted: Orders

## 2012-10-20 ENCOUNTER — Ambulatory Visit: Payer: MEDICARE | Admitting: Vascular Surgery

## 2012-10-28 ENCOUNTER — Other Ambulatory Visit: Payer: Self-pay

## 2012-11-03 ENCOUNTER — Ambulatory Visit: Payer: MEDICARE | Admitting: Vascular Surgery

## 2012-12-10 ENCOUNTER — Telehealth: Payer: Self-pay | Admitting: Cardiology

## 2012-12-10 NOTE — Telephone Encounter (Signed)
Returned call to patient no answer.LMTC. 

## 2012-12-10 NOTE — Telephone Encounter (Signed)
Pt is returning Cheryl's phone call  °

## 2012-12-10 NOTE — Telephone Encounter (Signed)
OK for sedation for orthodontic procedure.  Yousra Ivens [place] MD, [facility]

## 2012-12-10 NOTE — Telephone Encounter (Signed)
See previous 12/10/12 note. 

## 2012-12-10 NOTE — Telephone Encounter (Signed)
Follow up ° ° ° °Returning call  °

## 2012-12-10 NOTE — Telephone Encounter (Signed)
Spoke with patient Dr.Jordan advised ok for sedation for orthodontic procedure.

## 2012-12-10 NOTE — Telephone Encounter (Signed)
Message sent to Dr.Jordan for advice. 

## 2012-12-10 NOTE — Telephone Encounter (Signed)
New Problem  Pt wants to know if it is okay for pt to be on anesthesia//// Fentanyl/// states that was for pain.. Versed(relax)... Decadron for swelling.. Wants to make sure that these medications are okay for her heart during her orthodontist visit.

## 2012-12-17 ENCOUNTER — Other Ambulatory Visit: Payer: MEDICARE

## 2012-12-22 ENCOUNTER — Other Ambulatory Visit: Payer: MEDICARE

## 2012-12-29 ENCOUNTER — Other Ambulatory Visit (INDEPENDENT_AMBULATORY_CARE_PROVIDER_SITE_OTHER): Payer: MEDICARE

## 2012-12-29 ENCOUNTER — Other Ambulatory Visit: Payer: MEDICARE

## 2012-12-29 DIAGNOSIS — E7800 Pure hypercholesterolemia, unspecified: Secondary | ICD-10-CM

## 2012-12-29 LAB — HEPATIC FUNCTION PANEL
ALT: 29 U/L (ref 0–35)
AST: 32 U/L (ref 0–37)
Albumin: 4.2 g/dL (ref 3.5–5.2)
Alkaline Phosphatase: 68 U/L (ref 39–117)
Total Bilirubin: 0.8 mg/dL (ref 0.3–1.2)

## 2012-12-29 LAB — LIPID PANEL
HDL: 49.3 mg/dL (ref >39.00)
Total CHOL/HDL Ratio: 4
Triglycerides: 97 mg/dL (ref 0.0–149.0)

## 2013-01-01 ENCOUNTER — Telehealth: Payer: Self-pay | Admitting: Cardiology

## 2013-01-01 NOTE — Telephone Encounter (Signed)
Returned call to patient lab results given.Copy of lab mailed to patient. 

## 2013-01-01 NOTE — Telephone Encounter (Signed)
New problem:  Pt states she is calling for her lab results.

## 2013-01-28 ENCOUNTER — Other Ambulatory Visit: Payer: Self-pay

## 2013-04-19 ENCOUNTER — Encounter: Payer: Self-pay | Admitting: Vascular Surgery

## 2013-04-20 ENCOUNTER — Ambulatory Visit (HOSPITAL_COMMUNITY)
Admission: RE | Admit: 2013-04-20 | Discharge: 2013-04-20 | Disposition: A | Discharge status: Home / Self Care | Payer: MEDICARE | Source: Ambulatory Visit | Attending: Vascular Surgery | Admitting: Vascular Surgery

## 2013-04-20 ENCOUNTER — Ambulatory Visit (INDEPENDENT_AMBULATORY_CARE_PROVIDER_SITE_OTHER): Payer: MEDICARE | Admitting: Vascular Surgery

## 2013-04-20 ENCOUNTER — Encounter: Payer: Self-pay | Admitting: Vascular Surgery

## 2013-04-20 VITALS — BP 179/75 | HR 64 | Resp 16 | Ht 70.5 in | Wt 152.0 lb

## 2013-04-20 DIAGNOSIS — I6529 Occlusion and stenosis of unspecified carotid artery: Secondary | ICD-10-CM | POA: Diagnosis not present

## 2013-04-20 DIAGNOSIS — Z48812 Encounter for surgical aftercare following surgery on the circulatory system: Secondary | ICD-10-CM

## 2013-04-20 NOTE — Progress Notes (Signed)
Subjective:     Patient ID: Ashley Berg, female   DOB: 1945/07/26, 68 y.o.   MRN: [id number]  HPI is a 68 year old female returns for initial followup regarding her left carotid endarterectomy was performed in July of 2014 for severe asymptomatic left ICA stenosis. She has remained asymptomatic since her surgery. She has had no hoarseness as well as well. Denies lateralizing weakness, aphasia, amaurosis fugax, diplopia, blurred vision, and syncope. She takes one 81 mg aspirin tablet per day. She is also followed for her atrial fibrillation by Dr. Peter [place] and takes digoxin.  Past Medical History  Diagnosis Date  . PAF (paroxysmal atrial fibrillation)   . Hypothyroidism     Refuses conventional medicine  . Hypertension   . Seasonal allergies   . Carotid artery occlusion     History  Substance Use Topics  . Smoking status: Never Smoker   . Smokeless tobacco: Never Used  . Alcohol Use: No    Family History  Problem Relation Age of Onset  . Stroke Father   . Alzheimer's disease Mother     Allergies  Allergen Reactions  . Broccoli [Brassica Oleracea Italica]     GI upset  . Chicken Protein     Does not eat meat  . Milk-Related Compounds     GI upset  . Mushroom Extract Complex     GI upset  . Onion     GI upset  . Penicillins Other (See Comments)    "skin crawls"    Current outpatient prescriptions:aspirin EC 81 MG tablet, Take 1 tablet (81 mg total) by mouth daily., Disp: 30 tablet, Rfl: 11;  b complex vitamins tablet, Take 1 tablet by mouth daily. , Disp: , Rfl: ;  Boswellia-Glucosamine-Vit D (GLUCOSAMINE COMPLEX) TABS, Take 1 tablet by mouth daily., Disp: , Rfl: ;  Calcium-Magnesium-Vitamin D (CALCIUM MAGNESIUM PO), Take 30 mLs by mouth daily. Oral suspension, Disp: , Rfl:  digoxin (LANOXIN) 0.25 MG tablet, Take 0.25 mg by mouth daily., Disp: , Rfl: ;  Flaxseed, Linseed, (FLAX SEED OIL) 1000 MG CAPS, Take 1,000 mg by mouth every other day. , Disp: , Rfl: ;  Glucosamine  HCl (GLUCOSAMINE PO), Take 1 tablet by mouth daily., Disp: , Rfl: ;  Homeopathic Products (SIMILASAN DRY EYE RELIEF) SOLN, Place 1 drop into both eyes as needed (dry eyes)., Disp: , Rfl:  levothyroxine (SYNTHROID, LEVOTHROID) 100 MCG tablet, Take 50 mcg by mouth daily before breakfast., Disp: , Rfl: ;  Olmesartan-Amlodipine-HCTZ (TRIBENZOR) 20-5-12.5 MG TABS, Take 1 tablet by mouth daily., Disp: , Rfl: ;  OVER THE COUNTER MEDICATION, Take 1 tablet by mouth daily. Natural thyroid support, Disp: , Rfl: ;  OVER THE COUNTER MEDICATION, Take 1 capsule by mouth daily. polycosanol natural supplement, Disp: , Rfl:  Red Yeast Rice Extract (RED YEAST RICE PO), Take 1 capsule by mouth daily., Disp: , Rfl: ;  vitamin E 400 UNIT capsule, Take 400 Units by mouth every other day. , Disp: , Rfl: ;  oxyCODONE (ROXICODONE) 5 MG immediate release tablet, Take 1 tablet (5 mg total) by mouth every 6 (six) hours as needed for pain., Disp: 30 tablet, Rfl: 0 [DISCONTINUED] hydrochlorothiazide (MICROZIDE) 12.5 MG capsule, Take 1 capsule (12.5 mg total) by mouth daily., Disp: 30 capsule, Rfl: 5  BP 179/75  Pulse 64  Resp 16  Ht 5' 10.5" (1.791 m)  Wt 152 lb (68.947 kg)  BMI 21.49 kg/m2  Body mass index is 21.49 kg/(m^2).  Review of Systems denies chest pain, dyspnea on exertion, PND, orthopnea, hemoptysis, claudication. All systems negative and complete review of systems    Objective:   Physical Exam BP 179/75  Pulse 64  Resp 16  Ht 5' 10.5" (1.791 m)  Wt 152 lb (68.947 kg)  BMI 21.49 kg/m2  Gen.-alert and oriented x3 in no apparent distress HEENT normal for age Lungs no rhonchi or wheezing Cardiovascular regular rhythm no murmurs carotid pulses 3+ palpable no bruits audible Abdomen soft nontender no palpable masses Musculoskeletal free of  major deformities Skin clear -no rashes Neurologic normal Lower extremities 3+ femoral and dorsalis pedis pulses palpable bilaterally with no  edema  Today I ordered a carotid duplex exam which are reviewed and interpreted. Left carotid endarterectomy site is widely patent with no evidence of restenosis. The right internal carotid has approximately 40% stenosis which has not progressed.     Assessment:     Widely patent left carotid endarterectomy site 6 months post carotid endarterectomy-patient asymptomatic with mild right ICA stenosis  return in 6 months for followup carotid duplex exam and then we'll follow on an annual basis Continue daily aspirin-81 mg    Plan:

## 2013-04-20 NOTE — Addendum Note (Signed)
Addended by: Adria DillELDRIDGE-LEWIS, Bridgitt Raggio L on: 04/20/2013 02:22 PM   Modules accepted: Orders

## 2013-07-05 ENCOUNTER — Other Ambulatory Visit: Payer: Self-pay | Admitting: Cardiology

## 2013-07-15 ENCOUNTER — Other Ambulatory Visit: Payer: Self-pay | Admitting: Cardiology

## 2013-08-17 ENCOUNTER — Encounter: Payer: Self-pay | Admitting: Cardiology

## 2013-08-17 ENCOUNTER — Ambulatory Visit (INDEPENDENT_AMBULATORY_CARE_PROVIDER_SITE_OTHER): Payer: MEDICARE | Admitting: Cardiology

## 2013-08-17 VITALS — BP 160/72 | HR 67 | Ht 70.5 in | Wt 154.0 lb

## 2013-08-17 DIAGNOSIS — I6529 Occlusion and stenosis of unspecified carotid artery: Secondary | ICD-10-CM

## 2013-08-17 DIAGNOSIS — E785 Hyperlipidemia, unspecified: Secondary | ICD-10-CM | POA: Diagnosis not present

## 2013-08-17 DIAGNOSIS — I10 Essential (primary) hypertension: Secondary | ICD-10-CM

## 2013-08-17 HISTORY — DX: Hyperlipidemia, unspecified: E78.5

## 2013-08-17 MED ORDER — OLMESARTAN-AMLODIPINE-HCTZ 40-5-12.5 MG PO TABS: 1.0000 | ORAL_TABLET | Freq: Every day | ORAL | Status: DC

## 2013-08-17 NOTE — Progress Notes (Signed)
Ashley Berg Date of Birth: 1945/06/07   History of Present Illness: Ashley Berg is seen back today for a followup visit. She has a history of HTN and hyperlipidemia. She reports that she is doing well. She was seen last year and noted to have a carotid bruit. She was found to have a high grade lesion in the left ICA. She had successful CEA. She denies any chest pain or SOB. No TIA symptoms. No claudication. She is reluctant to take prescription medication but is using red yeast rice for cholesterol.  Current Outpatient Prescriptions on File Prior to Visit  Medication Sig Dispense Refill  . aspirin EC 81 MG tablet Take 1 tablet (81 mg total) by mouth daily.  30 tablet  11  . b complex vitamins tablet Take 1 tablet by mouth daily.       . Boswellia-Glucosamine-Vit D (GLUCOSAMINE COMPLEX) TABS Take 1 tablet by mouth daily.      . Calcium-Magnesium-Vitamin D (CALCIUM MAGNESIUM PO) Take 30 mLs by mouth daily. Oral suspension      . DIGOX 250 MCG tablet TAKE 1 TABLET EVERY DAY  90 tablet  0  . Glucosamine HCl (GLUCOSAMINE PO) Take 1 tablet by mouth daily.      . Homeopathic Products (SIMILASAN DRY EYE RELIEF) SOLN Place 1 drop into both eyes as needed (dry eyes).      [name]. levothyroxine (SYNTHROID, LEVOTHROID) 100 MCG tablet Take 50 mcg by mouth daily before breakfast.      . OVER THE COUNTER MEDICATION Take 1 tablet by mouth daily. Natural thyroid support      . OVER THE COUNTER MEDICATION Take 1 capsule by mouth daily. polycosanol natural supplement      . Red Yeast Rice Extract (RED YEAST RICE PO) Take 1 capsule by mouth daily.      . vitamin E 400 UNIT capsule Take 400 Units by mouth every other day.       . [DISCONTINUED] hydrochlorothiazide (MICROZIDE) 12.5 MG capsule Take 1 capsule (12.5 mg total) by mouth daily.  30 capsule  5   No current facility-administered medications on file prior to visit.    Allergies  Allergen Reactions  . Broccoli [Brassica Oleracea Italica]     GI upset  .  Chicken Protein     Does not eat meat  . Milk-Related Compounds     GI upset  . Mushroom Extract Complex     GI upset  . Onion     GI upset  . Penicillins Other (See Comments)    "skin crawls"    Past Medical History  Diagnosis Date  . PAF (paroxysmal atrial fibrillation)   . Hypothyroidism     Refuses conventional medicine  . Hypertension   . Seasonal allergies   . Carotid artery occlusion   . Hyperlipidemia 08/17/2013    Past Surgical History  Procedure Laterality Date  . Appendectomy    . Tubal ligation    . External ear surgery Right 1983    tympanoplasty with patch  . Doppler echocardiography  03/02/1999    NORMAL LEFT VENTRICULAR SIZE. EF 55%  . Cervical polypectomy    . Endarterectomy Left 09/24/2012    Procedure: ENDARTERECTOMY CAROTID;  Surgeon: Pryor OchoaJames D Lawson, MD;  [facility];  Service: Vascular;  Laterality: Left;  . Patch angioplasty Left 09/24/2012    Procedure: PATCH ANGIOPLASTY;  Surgeon: Pryor OchoaJames D Lawson, MD;  [facility];  Service: Vascular;  Laterality: Left;  . Carotid  endarterectomy      History  Smoking status  . Never Smoker   Smokeless tobacco  . Never Used    History  Alcohol Use No    Family History  Problem Relation Age of Onset  . Stroke Father   . Alzheimer's disease Mother     Review of Systems: As noted in HPI.   All other systems were reviewed and are negative.  Physical Exam: BP 160/72  Pulse 67  Ht 5' 10.5" (1.791 m)  Wt 154 lb (69.854 kg)  BMI 21.78 kg/m2 Patient is pleasant and in no acute distress.  Skin is warm and dry. Color is normal.  HEENT is unremarkable except she appears to have exophthalmus. Normocephalic/atraumatic. PERRL. Sclera are nonicteric. Neck is supple. No masses. No JVD. Left CEA scar has healed well.Lungs are clear. Cardiac exam shows a regular rate and rhythm. Abdomen is soft. Extremities are without edema. Gait and ROM are intact. No gross neurologic deficits noted.  LABORATORY DATA:  ECG  today shows normal sinus rhythm with nonspecific ST abnormality. RV conduction delay.  Lab Results  Component Value Date   WBC 5.5 09/25/2012   HGB 11.8* 09/25/2012   HCT 34.8* 09/25/2012   PLT 145* 09/25/2012   GLUCOSE 107* 09/25/2012   CHOL 184 12/29/2012   TRIG 97.0 12/29/2012   HDL 49.30 12/29/2012   LDLDIRECT 144.4 09/08/2012   LDLCALC 115* 12/29/2012   ALT 29 12/29/2012   AST 32 12/29/2012   NA 140 09/25/2012   K 3.7 09/25/2012   CL 107 09/25/2012   CREATININE 0.59 09/25/2012   BUN 12 09/25/2012   CO2 28 09/25/2012   TSH 3.30 09/08/2012   INR 1.00 09/22/2012    Assessment / Plan: 1. Carotid arterial disease s/p left CEA. Stressed the importance of risk factor modification. Continue ASA.  2. HTN poorly controlled. Will increase Tribenzor to 40/5/12.5 mg daily. I asked that she monitor her BP at home and keep a diary to bring her next visit.  3. Hyperlipidemia. Cholesterol did drop by 22 points with red yeast rice but she is still not at goal. Will repeat fasting lab work. She is opposed to taking statin therapy.  4. Paroxysmal AFib no symptomatic recurrence.

## 2013-08-17 NOTE — Patient Instructions (Signed)
Continue your dietary modifications.  We will increase Tribenzor to 40/5/12.5 mg daily  Monitor your Blood pressure at home and keep a diary to bring with you on your next visit.  We will schedule you for fasting lab work

## 2013-08-20 ENCOUNTER — Other Ambulatory Visit (INDEPENDENT_AMBULATORY_CARE_PROVIDER_SITE_OTHER): Payer: MEDICARE

## 2013-08-20 DIAGNOSIS — I6529 Occlusion and stenosis of unspecified carotid artery: Secondary | ICD-10-CM | POA: Diagnosis not present

## 2013-08-20 DIAGNOSIS — E785 Hyperlipidemia, unspecified: Secondary | ICD-10-CM | POA: Diagnosis not present

## 2013-08-20 DIAGNOSIS — I10 Essential (primary) hypertension: Secondary | ICD-10-CM

## 2013-08-20 LAB — LIPID PANEL
CHOLESTEROL: 169 mg/dL (ref 0–200)
HDL: 41.8 mg/dL (ref >39.00)
LDL CALC: 110 mg/dL — AB (ref 0–99)
TRIGLYCERIDES: 86 mg/dL (ref 0.0–149.0)
Total CHOL/HDL Ratio: 4
VLDL: 17.2 mg/dL (ref 0.0–40.0)

## 2013-08-20 LAB — HEPATIC FUNCTION PANEL
ALBUMIN: 4.2 g/dL (ref 3.5–5.2)
ALT: 23 U/L (ref 0–35)
AST: 25 U/L (ref 0–37)
Alkaline Phosphatase: 63 U/L (ref 39–117)
Bilirubin, Direct: 0.1 mg/dL (ref 0.0–0.3)
TOTAL PROTEIN: 6.9 g/dL (ref 6.0–8.3)
Total Bilirubin: 0.7 mg/dL (ref 0.2–1.2)

## 2013-08-20 LAB — BASIC METABOLIC PANEL
BUN: 21 mg/dL (ref 6–23)
CHLORIDE: 104 meq/L (ref 96–112)
CO2: 30 mEq/L (ref 19–32)
Calcium: 10.7 mg/dL — ABNORMAL HIGH (ref 8.4–10.5)
Creatinine, Ser: 0.8 mg/dL (ref 0.4–1.2)
GFR: 74.58 mL/min (ref >60.00)
Glucose, Bld: 102 mg/dL — ABNORMAL HIGH (ref 70–99)
POTASSIUM: 3.9 meq/L (ref 3.5–5.1)
SODIUM: 141 meq/L (ref 135–145)

## 2013-08-26 ENCOUNTER — Telehealth: Payer: Self-pay | Admitting: Cardiology

## 2013-08-26 NOTE — Telephone Encounter (Signed)
New message     Returning Cheryl's call to get lab results

## 2013-08-27 NOTE — Telephone Encounter (Signed)
Patient called lab results given. 

## 2013-10-05 ENCOUNTER — Telehealth: Payer: Self-pay | Admitting: Cardiology

## 2013-10-05 MED ORDER — OLMESARTAN-AMLODIPINE-HCTZ 40-5-12.5 MG PO TABS: 1.0000 | ORAL_TABLET | Freq: Every day | ORAL | Status: DC

## 2013-10-05 NOTE — Telephone Encounter (Signed)
Returned call to patient no answer.LMTC. 

## 2013-10-05 NOTE — Telephone Encounter (Signed)
Please have Elnita MaxwellCheryl call her,she need her to update some infiomation with her. Also need her Tribenzor changed from #30 to #90. Please call this to [phone].

## 2013-10-05 NOTE — Telephone Encounter (Signed)
Received call from patient she stated she wanted to verify her next appointment with Dr.Jordan.Advised she is in his recalls for 11/15.Advised to call back in Sept to schedule Nov appointment.90 day refill sent to pharmacy for Tribenzor.

## 2013-10-15 ENCOUNTER — Other Ambulatory Visit: Payer: Self-pay | Admitting: Cardiology

## 2013-10-18 ENCOUNTER — Encounter: Payer: Self-pay | Admitting: Vascular Surgery

## 2013-10-19 ENCOUNTER — Ambulatory Visit (INDEPENDENT_AMBULATORY_CARE_PROVIDER_SITE_OTHER): Payer: MEDICARE | Admitting: Vascular Surgery

## 2013-10-19 ENCOUNTER — Ambulatory Visit (HOSPITAL_COMMUNITY)
Admission: RE | Admit: 2013-10-19 | Discharge: 2013-10-19 | Disposition: A | Discharge status: Home / Self Care | Payer: MEDICARE | Source: Ambulatory Visit | Attending: Vascular Surgery | Admitting: Vascular Surgery

## 2013-10-19 ENCOUNTER — Encounter: Payer: Self-pay | Admitting: Vascular Surgery

## 2013-10-19 VITALS — BP 166/74 | HR 66 | Resp 16 | Ht 70.5 in | Wt 150.0 lb

## 2013-10-19 DIAGNOSIS — Z48812 Encounter for surgical aftercare following surgery on the circulatory system: Secondary | ICD-10-CM | POA: Diagnosis not present

## 2013-10-19 DIAGNOSIS — I6529 Occlusion and stenosis of unspecified carotid artery: Secondary | ICD-10-CM

## 2013-10-19 NOTE — Progress Notes (Signed)
Subjective:     Patient ID: Ashley Berg, female   DOB: 02/02/1946, 68 y.o.   MRN: [id number]  HPI this 68 year old female returns for continued followup regarding her left carotid endarterectomy which I performed July , 2014. Patient was asymptomatic preoperatively and remains asymptomatic. She has no complaints including lateralizing weakness, aphasia, procedures, diplopia, blurred vision, or syncope. She also denies any significant claudication symptoms in her lower extremities. She has no history of coronary artery disease. She does have a history of atrial fibrillation and was treated with digoxin and is followed by Dr. Peter Swaziland. She takes daily aspirin.  Past Medical History  Diagnosis Date  . PAF (paroxysmal atrial fibrillation)   . Hypothyroidism     Refuses conventional medicine  . Hypertension   . Seasonal allergies   . Carotid artery occlusion   . Hyperlipidemia 08/17/2013    History  Substance Use Topics  . Smoking status: Never Smoker   . Smokeless tobacco: Never Used  . Alcohol Use: No    Family History  Problem Relation Age of Onset  . Stroke Father   . Alzheimer's disease Mother     Allergies  Allergen Reactions  . Broccoli [Brassica Oleracea Italica]     GI upset  . Chicken Protein     Does not eat meat  . Milk-Related Compounds     GI upset  . Mushroom Extract Complex     GI upset  . Onion     GI upset  . Penicillins Other (See Comments)    "skin crawls"    Current outpatient prescriptions:aspirin EC 81 MG tablet, Take 1 tablet (81 mg total) by mouth daily., Disp: 30 tablet, Rfl: 11;  b complex vitamins tablet, Take 1 tablet by mouth daily. , Disp: , Rfl: ;  digoxin (LANOXIN) 0.25 MG tablet, TAKE 1 TABLET EVERY DAY, Disp: 90 tablet, Rfl: 1;  Glucosamine HCl (GLUCOSAMINE PO), Take 1 tablet by mouth daily., Disp: , Rfl:  Homeopathic Products (SIMILASAN DRY EYE RELIEF) SOLN, Place 1 drop into both eyes as needed (dry eyes)., Disp: , Rfl: ;   levothyroxine (SYNTHROID, LEVOTHROID) 100 MCG tablet, Take 50 mcg by mouth daily before breakfast., Disp: , Rfl: ;  Olmesartan-Amlodipine-HCTZ (TRIBENZOR) 40-5-12.5 MG TABS, Take 1 tablet by mouth daily., Disp: 90 tablet, Rfl: 3;  OVER THE COUNTER MEDICATION, Take 1 tablet by mouth daily. Natural thyroid support, Disp: , Rfl:  OVER THE COUNTER MEDICATION, Take 1 capsule by mouth daily. polycosanol natural supplement, Disp: , Rfl: ;  Red Yeast Rice Extract (RED YEAST RICE PO), Take 1 capsule by mouth daily., Disp: , Rfl: ;  vitamin E 400 UNIT capsule, Take 400 Units by mouth every other day. , Disp: , Rfl: ;  Boswellia-Glucosamine-Vit D (GLUCOSAMINE COMPLEX) TABS, Take 1 tablet by mouth daily., Disp: , Rfl:  Calcium-Magnesium-Vitamin D (CALCIUM MAGNESIUM PO), Take 30 mLs by mouth daily. Oral suspension, Disp: , Rfl: ;  [DISCONTINUED] hydrochlorothiazide (MICROZIDE) 12.5 MG capsule, Take 1 capsule (12.5 mg total) by mouth daily., Disp: 30 capsule, Rfl: 5  BP 166/74  Pulse 66  Resp 16  Ht 5' 10.5" (1.791 m)  Wt 150 lb (68.04 kg)  BMI 21.21 kg/m2  Body mass index is 21.21 kg/(m^2).          Review of Systems does have occasional palpitations and dizziness at times. No chest pain or dyspnea on exertion-see history of present illness Older systems negative and a complete review of systems    Objective:  Physical Exam BP 166/74  Pulse 66  Resp 16  Ht 5' 10.5" (1.791 m)  Wt 150 lb (68.04 kg)  BMI 21.21 kg/m2  Gen.-alert and oriented x3 in no apparent distress HEENT normal for age Lungs no rhonchi or wheezing Cardiovascular regular rhythm no murmurs carotid pulses 3+ palpable no bruits audible Abdomen soft nontender no palpable masses Musculoskeletal free of  major deformities Skin clear -no rashes Neurologic normal Lower extremities 3+ femoral and dorsalis pedis pulses palpable bilaterally with no edema  Today I will order carotid duplex exam which I reviewed and interpreted. A  left carotid endarterectomy site is widely patent with no evidence of restenosis. There is less than 40% right ICA stenosis which is unchanged.        Assessment:     Doing well 1 year post left carotid endarterectomy for severe asymptomatic stenosis with mild right CVA stenosis-asymptomatic History of paroxysmal atrial fibrillation-on digoxin followed by Dr. Peter SwazilandJordan    Plan:     Return one year with carotid duplex exam and continue daily aspirin If patient develops any neurologic symptoms in the interim she will be in touch with us

## 2013-10-19 NOTE — Addendum Note (Signed)
Addended by: Sharee PimpleMCCHESNEY, Anyely Cunning K on: 10/19/2013 03:09 PM   Modules accepted: Orders

## 2014-01-05 ENCOUNTER — Telehealth: Payer: Self-pay | Admitting: Cardiology

## 2014-01-05 NOTE — Telephone Encounter (Signed)
Please call,she need to talk to you about the Tribemzor she is taking.

## 2014-01-07 ENCOUNTER — Other Ambulatory Visit: Payer: Self-pay

## 2014-01-07 ENCOUNTER — Telehealth: Payer: Self-pay | Admitting: Cardiology

## 2014-01-07 NOTE — Telephone Encounter (Signed)
She may take extra amlodipine 5 mg when BP is high.   Peter SwazilandJordan MD, [facility]

## 2014-01-07 NOTE — Telephone Encounter (Signed)
Returned call to patient 01/05/14 she stated she is in dough nut hole.She stated Tribenzor 40-5-12.5 mg is not controlling her B/P.Stated systolic B/P systolic 167.Stated when her B/P is up she don't check it again for a few days.Stated it makes her anxious.Stated she takes a chinese B/P medication from the [facility] store when B/P is up and it brings her B/P down.Stated she has told Dr.Jordan she takes this.Stated she wants to keep taking same medication and will monitor B/P and call back if continues to be elevated.  Received a call from patient this morning she stated she is very nervous B/P last night systolic 199.Stated she has not taken B/P this morning Stated she wanted to ask Dr.Jordan if ok to take a extra amlodipine 5 mg when B/P elevated.Also stated she has been having chest pressure off and on for the last 2 weeks.Stated pressure is relieved when she takes a gas medication.Stated she would like appointment with Dr.Jordan does not want to see a extender.Appointment scheduled with Dr.Jordan 01/12/14 at 11:15 am.Patient wants to know if B/P elevated over the weekend can she take a extra amlodipine 5 mg.Message sent to Dr.Jordan for advice.

## 2014-01-07 NOTE — Telephone Encounter (Signed)
Returned call to patient Dr.Jordan advised ok to take a extra Amlodipine 5 mg when B/P is high.

## 2014-01-07 NOTE — Telephone Encounter (Signed)
See previous 01/07/14 note.

## 2014-01-12 ENCOUNTER — Encounter: Payer: Self-pay | Admitting: Cardiology

## 2014-01-12 ENCOUNTER — Ambulatory Visit (INDEPENDENT_AMBULATORY_CARE_PROVIDER_SITE_OTHER): Payer: MEDICARE | Admitting: Cardiology

## 2014-01-12 VITALS — BP 138/80 | HR 76 | Ht 70.0 in | Wt 144.3 lb

## 2014-01-12 DIAGNOSIS — I779 Disorder of arteries and arterioles, unspecified: Secondary | ICD-10-CM

## 2014-01-12 DIAGNOSIS — I6529 Occlusion and stenosis of unspecified carotid artery: Secondary | ICD-10-CM

## 2014-01-12 DIAGNOSIS — E785 Hyperlipidemia, unspecified: Secondary | ICD-10-CM

## 2014-01-12 DIAGNOSIS — I739 Peripheral vascular disease, unspecified: Secondary | ICD-10-CM

## 2014-01-12 DIAGNOSIS — I10 Essential (primary) hypertension: Secondary | ICD-10-CM

## 2014-01-12 MED ORDER — METOPROLOL SUCCINATE ER 25 MG PO TB24: 12.5000 mg | ORAL_TABLET | Freq: Every day | ORAL | Status: DC

## 2014-01-12 NOTE — Patient Instructions (Signed)
Continue your current therapy   We will add Toprol XL 12.5 mg daily in the afternoon.  I will see you in 6 months.

## 2014-01-12 NOTE — Progress Notes (Signed)
Ashley OchsJudy L Berg Date of Birth: [birth year]   History of Present Illness: Ashley HongJudy is seen back today for a followup visit. She has a history of HTN and hyperlipidemia. She is s/p left CEA. She denies any chest pain or SOB. No TIA symptoms. No claudication. She reports that over the past week she has been very anxious and fearful about her BP. She brings a diary of her BP over the past 4 months with readings between 123-148 typically. She states she doesn't always write down her bad readings because in makes her anxious. Last week her BP increased to 199 systolic. She took 5 mg amlodipine but this made her dizzy and caused "dreams". She is only taking 20 mg Tribenzor instead of 40 mg. She sometimes feels her heart beating hard. She is taking aloe vera to help settle down her GI track.   Current Outpatient Prescriptions on File Prior to Visit  Medication Sig Dispense Refill  . aspirin EC 81 MG tablet Take 1 tablet (81 mg total) by mouth daily.  30 tablet  11  . b complex vitamins tablet Take 1 tablet by mouth daily.       . digoxin (LANOXIN) 0.25 MG tablet TAKE 1 TABLET EVERY DAY  90 tablet  1  . Glucosamine HCl (GLUCOSAMINE PO) Take 2 tablets by mouth daily.       . Homeopathic Products (SIMILASAN DRY EYE RELIEF) SOLN Place 1 drop into both eyes as needed (dry eyes).      [name]. levothyroxine (SYNTHROID, LEVOTHROID) 100 MCG tablet Take 50 mcg by mouth daily before breakfast.      . Olmesartan-Amlodipine-HCTZ (TRIBENZOR) 40-5-12.5 MG TABS Take 1 tablet by mouth daily.  90 tablet  3  . OVER THE COUNTER MEDICATION Take 1 tablet by mouth daily. Natural thyroid support      . OVER THE COUNTER MEDICATION Take 1 capsule by mouth daily. polycosanol natural supplement      . Red Yeast Rice Extract (RED YEAST RICE PO) Take 1 capsule by mouth daily.      . vitamin E 400 UNIT capsule Take 400 Units by mouth every other day.       . [DISCONTINUED] hydrochlorothiazide (MICROZIDE) 12.5 MG capsule Take 1 capsule (12.5  mg total) by mouth daily.  30 capsule  5   No current facility-administered medications on file prior to visit.    Allergies  Allergen Reactions  . Broccoli [Brassica Oleracea Italica]     GI upset  . Chicken Protein     Does not eat meat  . Milk-Related Compounds     GI upset  . Mushroom Extract Complex     GI upset  . Onion     GI upset  . Penicillins Other (See Comments)    "skin crawls"    Past Medical History  Diagnosis Date  . PAF (paroxysmal atrial fibrillation)   . Hypothyroidism     Refuses conventional medicine  . Hypertension   . Seasonal allergies   . Carotid artery occlusion   . Hyperlipidemia 08/17/2013    Past Surgical History  Procedure Laterality Date  . Appendectomy    . Tubal ligation    . External ear surgery Right 1983    tympanoplasty with patch  . Doppler echocardiography  03/02/1999    NORMAL LEFT VENTRICULAR SIZE. EF 55%  . Cervical polypectomy    . Endarterectomy Left 09/24/2012    Procedure: ENDARTERECTOMY CAROTID;  Surgeon: Pryor OchoaJames D Lawson, MD;  [facility];  Service: Vascular;  Laterality: Left;  . Patch angioplasty Left 09/24/2012    Procedure: PATCH ANGIOPLASTY;  Surgeon: Pryor OchoaJames D Lawson, MD;  [facility];  Service: Vascular;  Laterality: Left;  . Carotid endarterectomy      History  Smoking status  . Never Smoker   Smokeless tobacco  . Never Used    History  Alcohol Use No    Family History  Problem Relation Age of Onset  . Stroke Father   . Alzheimer's disease Mother     Review of Systems: As noted in HPI.   All other systems were reviewed and are negative.  Physical Exam: BP 138/80  Pulse 76  Ht 5\' 10"  (1.778 m)  Wt 144 lb 4.8 oz (65.454 kg)  BMI 20.70 kg/m2 Patient is pleasant and in no acute distress.  Skin is warm and dry. Color is normal.  HEENT is unremarkable except she appears to have exophthalmus. Normocephalic/atraumatic. PERRL. Sclera are nonicteric. Neck is supple. No masses. No JVD. Left CEA scar  has healed well.Lungs are clear. Cardiac exam shows a regular rate and rhythm. Abdomen is soft. Extremities are without edema. Gait and ROM are intact. No gross neurologic deficits noted.  LABORATORY DATA:    Lab Results  Component Value Date   WBC 5.5 09/25/2012   HGB 11.8* 09/25/2012   HCT 34.8* 09/25/2012   PLT 145* 09/25/2012   GLUCOSE 102* 08/20/2013   CHOL 169 08/20/2013   TRIG 86.0 08/20/2013   HDL 41.80 08/20/2013   LDLDIRECT 144.4 09/08/2012   LDLCALC 110* 08/20/2013   ALT 23 08/20/2013   AST 25 08/20/2013   NA 141 08/20/2013   K 3.9 08/20/2013   CL 104 08/20/2013   CREATININE 0.8 08/20/2013   BUN 21 08/20/2013   CO2 30 08/20/2013   TSH 3.30 09/08/2012   INR 1.00 09/22/2012    Assessment / Plan: 1. Carotid arterial disease s/p left CEA. Stressed the importance of risk factor modification. Continue ASA.  2. HTN - continue Tribenzor to 40/5/12.5 mg daily. Will add Toprol XL 12.5 mg daily. She took Bystolic in 2012 but stopped because she felt "funny". Will try low dose beta blocker. There appears to be a strong adrenergic component to her BP elevation.  3. Hyperlipidemia. Opposed to taking statin. On red yeast rice.  4. Paroxysmal AFib no symptomatic recurrence.

## 2014-02-11 ENCOUNTER — Ambulatory Visit: Payer: MEDICARE | Admitting: Cardiology

## 2014-02-14 ENCOUNTER — Telehealth: Payer: Self-pay | Admitting: Cardiology

## 2014-02-14 NOTE — Telephone Encounter (Signed)
Returned call to patient no answer.LMTC. 

## 2014-02-14 NOTE — Telephone Encounter (Signed)
Please call,concerning her Toprol.

## 2014-02-15 NOTE — Telephone Encounter (Signed)
She wants her Toprol for 90 days instead of 30 days. Please call this to [phone]

## 2014-02-16 MED ORDER — METOPROLOL SUCCINATE ER 25 MG PO TB24: 12.5000 mg | ORAL_TABLET | Freq: Every day | ORAL | Status: DC

## 2014-02-16 NOTE — Telephone Encounter (Signed)
Returned call to patient metoprolol 90 day refill sent to pharmacy.

## 2014-03-29 ENCOUNTER — Other Ambulatory Visit: Payer: Self-pay | Admitting: *Deleted

## 2014-03-29 ENCOUNTER — Telehealth: Payer: Self-pay | Admitting: Cardiology

## 2014-03-29 MED ORDER — LEVOTHYROXINE SODIUM 100 MCG PO TABS: 50.0000 ug | ORAL_TABLET | Freq: Every day | ORAL | Status: DC

## 2014-03-29 NOTE — Telephone Encounter (Signed)
Pt still have not received her Levothyroxine #90. Would you please call to [phone].

## 2014-04-05 ENCOUNTER — Telehealth: Payer: Self-pay | Admitting: Cardiology

## 2014-04-05 NOTE — Telephone Encounter (Signed)
Pt called in wanting to speak with Ashley Berg about her Levothyroxine medication. Please call  thanks

## 2014-04-07 NOTE — Telephone Encounter (Signed)
Returned call to patient 03/27/14 she stated she would like 90 tablets of Levothyroxine.Stated she only received 45 tablets.Advised will call pharmacy and let them know.  [facility] called pharmacy tech stated insurance will not pay for 90 tablets.Patient called no answer.LMTC.

## 2014-04-08 ENCOUNTER — Telehealth: Payer: Self-pay | Admitting: Cardiology

## 2014-04-08 NOTE — Telephone Encounter (Signed)
Returning your call. °

## 2014-04-08 NOTE — Telephone Encounter (Signed)
Returned call to patient she will call me back when she needs a refill for levothyroxine.Stated pharmacy made a mistake and only gave her 45 tablets she always gets 90 tablets.

## 2014-04-11 ENCOUNTER — Other Ambulatory Visit: Payer: Self-pay

## 2014-04-11 MED ORDER — DIGOXIN 250 MCG PO TABS: 250.0000 ug | ORAL_TABLET | Freq: Every day | ORAL | Status: DC

## 2014-04-11 NOTE — Telephone Encounter (Signed)
Returned call to patient 04/08/14.She stated her prescription directions for levothyroxine sent incorrectly to pharmacy.Stated she will call me back when she needs a refill.

## 2014-04-22 ENCOUNTER — Telehealth: Payer: Self-pay | Admitting: Cardiology

## 2014-04-22 MED ORDER — DIGOXIN 250 MCG PO TABS: 250.0000 ug | ORAL_TABLET | Freq: Every day | ORAL | Status: DC

## 2014-04-22 NOTE — Telephone Encounter (Signed)
Returning your call. °

## 2014-04-22 NOTE — Telephone Encounter (Signed)
Returned call to patient she stated she needed a prior authorization for 90 tablets of digoxin.[employer] called [phone].Digoxin approved for 90 tablets.

## 2014-05-23 ENCOUNTER — Telehealth: Payer: Self-pay | Admitting: Cardiology

## 2014-05-23 NOTE — Telephone Encounter (Signed)
Pt called in wanting to speak with Ashley Berg about her Digoxin from a tier 3 to tier 2 medication and she wants to talk to her about her Levothryoxine medication as well. Please call back  Thanks

## 2014-05-24 MED ORDER — LEVOTHYROXINE SODIUM 100 MCG PO TABS: 100.0000 ug | ORAL_TABLET | Freq: Every day | ORAL | Status: DC

## 2014-05-24 NOTE — Telephone Encounter (Signed)
Returned call to patient she stated she needed 90 day refill for levothyroxine.Stated her insurance will be faxing a tier reduction form for digoxin.

## 2014-05-25 ENCOUNTER — Other Ambulatory Visit: Payer: Self-pay

## 2014-05-26 NOTE — Telephone Encounter (Signed)
She said was calling to see if you had faxed in the information for her Digoxin.

## 2014-05-26 NOTE — Telephone Encounter (Signed)
Returned call to patient lower tier form for digoxin completed and faxed to [employer] at fax # [phone].

## 2014-05-27 ENCOUNTER — Telehealth: Payer: Self-pay

## 2014-05-27 NOTE — Telephone Encounter (Signed)
Patient called received a fax from [employer] tier change for digoxin approved.

## 2014-07-01 ENCOUNTER — Encounter: Payer: Self-pay | Admitting: Cardiology

## 2014-07-01 ENCOUNTER — Ambulatory Visit (INDEPENDENT_AMBULATORY_CARE_PROVIDER_SITE_OTHER): Payer: MEDICARE | Admitting: Cardiology

## 2014-07-01 VITALS — BP 156/80 | HR 63 | Ht 70.0 in | Wt 145.8 lb

## 2014-07-01 DIAGNOSIS — E785 Hyperlipidemia, unspecified: Secondary | ICD-10-CM | POA: Diagnosis not present

## 2014-07-01 DIAGNOSIS — I10 Essential (primary) hypertension: Secondary | ICD-10-CM | POA: Diagnosis not present

## 2014-07-01 DIAGNOSIS — E039 Hypothyroidism, unspecified: Secondary | ICD-10-CM | POA: Diagnosis not present

## 2014-07-01 NOTE — Progress Notes (Signed)
Ashley Berg Date of Birth: Apr 21, 1945   History of Present Illness: Ashley HongJudy is seen back today for a followup visit. She has a history of HTN and hyperlipidemia. She is s/p left CEA. She has a history of hypothyroidism. She denies any chest pain or SOB. No TIA symptoms. No claudication. She notes that since taking metoprolol she feels much calmer. She still has minor palpitations at night but improved.   Current Outpatient Prescriptions on File Prior to Visit  Medication Sig Dispense Refill  . aspirin EC 81 MG tablet Take 1 tablet (81 mg total) by mouth daily. 30 tablet 11  . b complex vitamins tablet Take 1 tablet by mouth daily.     . digoxin (LANOXIN) 0.25 MG tablet Take 1 tablet (250 mcg total) by mouth daily. 90 tablet 3  . Glucosamine HCl (GLUCOSAMINE PO) Take 2 tablets by mouth daily.     . Homeopathic Products (SIMILASAN DRY EYE RELIEF) SOLN Place 1 drop into both eyes as needed (dry eyes).    [name]. levothyroxine (SYNTHROID) 100 MCG tablet Take 1 tablet (100 mcg total) by mouth daily before breakfast. 90 tablet 3  . Magnesium 400 MG CAPS Take 1 capsule by mouth 2 (two) times daily.    . metoprolol succinate (TOPROL XL) 25 MG 24 hr tablet Take 0.5 tablets (12.5 mg total) by mouth daily. 90 tablet 3  . Olmesartan-Amlodipine-HCTZ (TRIBENZOR) 40-5-12.5 MG TABS Take 1 tablet by mouth daily. 90 tablet 3  . OVER THE COUNTER MEDICATION Take 1 tablet by mouth daily. Natural thyroid support    . OVER THE COUNTER MEDICATION Take 1 capsule by mouth daily. polycosanol natural supplement    . Red Yeast Rice Extract (RED YEAST RICE PO) Take 1 capsule by mouth daily.    . vitamin E 400 UNIT capsule Take 400 Units by mouth every other day.     . [DISCONTINUED] hydrochlorothiazide (MICROZIDE) 12.5 MG capsule Take 1 capsule (12.5 mg total) by mouth daily. 30 capsule 5   No current facility-administered medications on file prior to visit.    Allergies  Allergen Reactions  . Broccoli [Brassica  Oleracea Italica]     GI upset  . Chicken Protein     Does not eat meat  . Milk-Related Compounds     GI upset  . Mushroom Extract Complex     GI upset  . Onion     GI upset  . Penicillins Other (See Comments)    "skin crawls"    Past Medical History  Diagnosis Date  . PAF (paroxysmal atrial fibrillation)   . Hypothyroidism     Refuses conventional medicine  . Hypertension   . Seasonal allergies   . Carotid artery occlusion   . Hyperlipidemia 08/17/2013    Past Surgical History  Procedure Laterality Date  . Appendectomy    . Tubal ligation    . External ear surgery Right 1983    tympanoplasty with patch  . Doppler echocardiography  03/02/1999    NORMAL LEFT VENTRICULAR SIZE. EF 55%  . Cervical polypectomy    . Endarterectomy Left 09/24/2012    Procedure: ENDARTERECTOMY CAROTID;  Surgeon: Pryor OchoaJames D Lawson, MD;  Location: [facility] OR;  Service: Vascular;  Laterality: Left;  . Patch angioplasty Left 09/24/2012    Procedure: PATCH ANGIOPLASTY;  Surgeon: Pryor OchoaJames D Lawson, MD;  [facility];  Service: Vascular;  Laterality: Left;  . Carotid endarterectomy      History  Smoking status  . Never  Smoker   Smokeless tobacco  . Never Used    History  Alcohol Use No    Family History  Problem Relation Age of Onset  . Stroke Father   . Alzheimer's disease Mother     Review of Systems: As noted in HPI.   All other systems were reviewed and are negative.  Physical Exam: BP 156/80 mmHg  Pulse 63  Ht  (1.778 m)  Wt 145 lb 12.8 oz (66.134 kg)  BMI 20.92 kg/m2 Patient is pleasant and in no acute distress.  Skin is warm and dry. Color is normal.  HEENT is unremarkable except she appears to have exophthalmus. Normocephalic/atraumatic. PERRL. Sclera are nonicteric. Neck is supple. No masses. No JVD. Left CEA scar has healed well.Lungs are clear. Cardiac exam shows a regular rate and rhythm. Abdomen is soft. Extremities are without edema. Gait and ROM are intact. No gross  neurologic deficits noted.  LABORATORY DATA:    Lab Results  Component Value Date   WBC 5.5 09/25/2012   HGB 11.8* 09/25/2012   HCT 34.8* 09/25/2012   PLT 145* 09/25/2012   GLUCOSE 102* 08/20/2013   CHOL 169 08/20/2013   TRIG 86.0 08/20/2013   HDL 41.80 08/20/2013   LDLDIRECT 144.4 09/08/2012   LDLCALC 110* 08/20/2013   ALT 23 08/20/2013   AST 25 08/20/2013   NA 141 08/20/2013   K 3.9 08/20/2013   CL 104 08/20/2013   CREATININE 0.8 08/20/2013   BUN 21 08/20/2013   CO2 30 08/20/2013   TSH 3.30 09/08/2012   INR 1.00 09/22/2012   Ecg today shows NSR with rightward axis, RV conduction delay. I have personally reviewed and interpreted this study.  Assessment / Plan: 1. Carotid arterial disease s/p left CEA. Stressed the importance of risk factor modification. Continue ASA.  2. HTN - continue Tribenzor to 40/5/12.5 mg daily and  Toprol XL 12.5 mg daily. She is tolerating this well.  There appears to be a strong adrenergic component to her BP elevation.  3. Hyperlipidemia. Opposed to taking statin. On red yeast rice. Check lipids today.  4. Paroxysmal AFib no symptomatic recurrence.  5. Hypothyroidism on synthroid. She has not had a TSH in 2 years- will check today.

## 2014-07-01 NOTE — Patient Instructions (Signed)
We will check lab work today  Continue your current therapy  I will see you in 6 months.   

## 2014-07-02 LAB — BASIC METABOLIC PANEL
BUN: 17 mg/dL (ref 6–23)
CALCIUM: 10.8 mg/dL — AB (ref 8.4–10.5)
CHLORIDE: 102 meq/L (ref 96–112)
CO2: 29 meq/L (ref 19–32)
Creat: 0.7 mg/dL (ref 0.50–1.10)
Glucose, Bld: 90 mg/dL (ref 70–99)
POTASSIUM: 4.1 meq/L (ref 3.5–5.3)
SODIUM: 139 meq/L (ref 135–145)

## 2014-07-02 LAB — HEPATIC FUNCTION PANEL
ALBUMIN: 4.7 g/dL (ref 3.5–5.2)
ALT: 20 U/L (ref 0–35)
AST: 28 U/L (ref 0–37)
Alkaline Phosphatase: 76 U/L (ref 39–117)
BILIRUBIN DIRECT: 0.2 mg/dL (ref 0.0–0.3)
BILIRUBIN TOTAL: 0.7 mg/dL (ref 0.2–1.2)
Indirect Bilirubin: 0.5 mg/dL (ref 0.2–1.2)
TOTAL PROTEIN: 7.7 g/dL (ref 6.0–8.3)

## 2014-07-02 LAB — TSH: TSH: 3.706 u[IU]/mL (ref 0.350–4.500)

## 2014-07-02 LAB — LIPID PANEL
CHOL/HDL RATIO: 3.8 ratio
Cholesterol: 173 mg/dL (ref 0–200)
HDL: 46 mg/dL (ref >=46)
LDL CALC: 103 mg/dL — AB (ref 0–99)
TRIGLYCERIDES: 122 mg/dL (ref <150)
VLDL: 24 mg/dL (ref 0–40)

## 2014-08-17 ENCOUNTER — Other Ambulatory Visit: Payer: Self-pay

## 2014-08-17 ENCOUNTER — Telehealth: Payer: Self-pay | Admitting: Cardiology

## 2014-08-17 MED ORDER — DIGOXIN 250 MCG PO TABS: 250.0000 ug | ORAL_TABLET | Freq: Every day | ORAL | Status: DC

## 2014-08-17 NOTE — Telephone Encounter (Signed)
Per note 4.8.16 

## 2014-08-17 NOTE — Telephone Encounter (Signed)
°  1. Which medications need to be refilled? Digoxin 2. Which pharmacy is medication to be sent to?[phone]  3. Do they need a 30 day or 90 day supply? 90 and refills  4. Would they like a call back once the medication has been sent to the pharmacy? yes

## 2014-09-18 IMAGING — CR DG CHEST 2V
2 series · 2 of 2 positions shown · non-contrast
Comparison: None.

CLINICAL DATA: Atrial fibrillation and hypertension; preoperative
carotid endarterectomy

CHEST - 2 VIEW

[w chest pa]
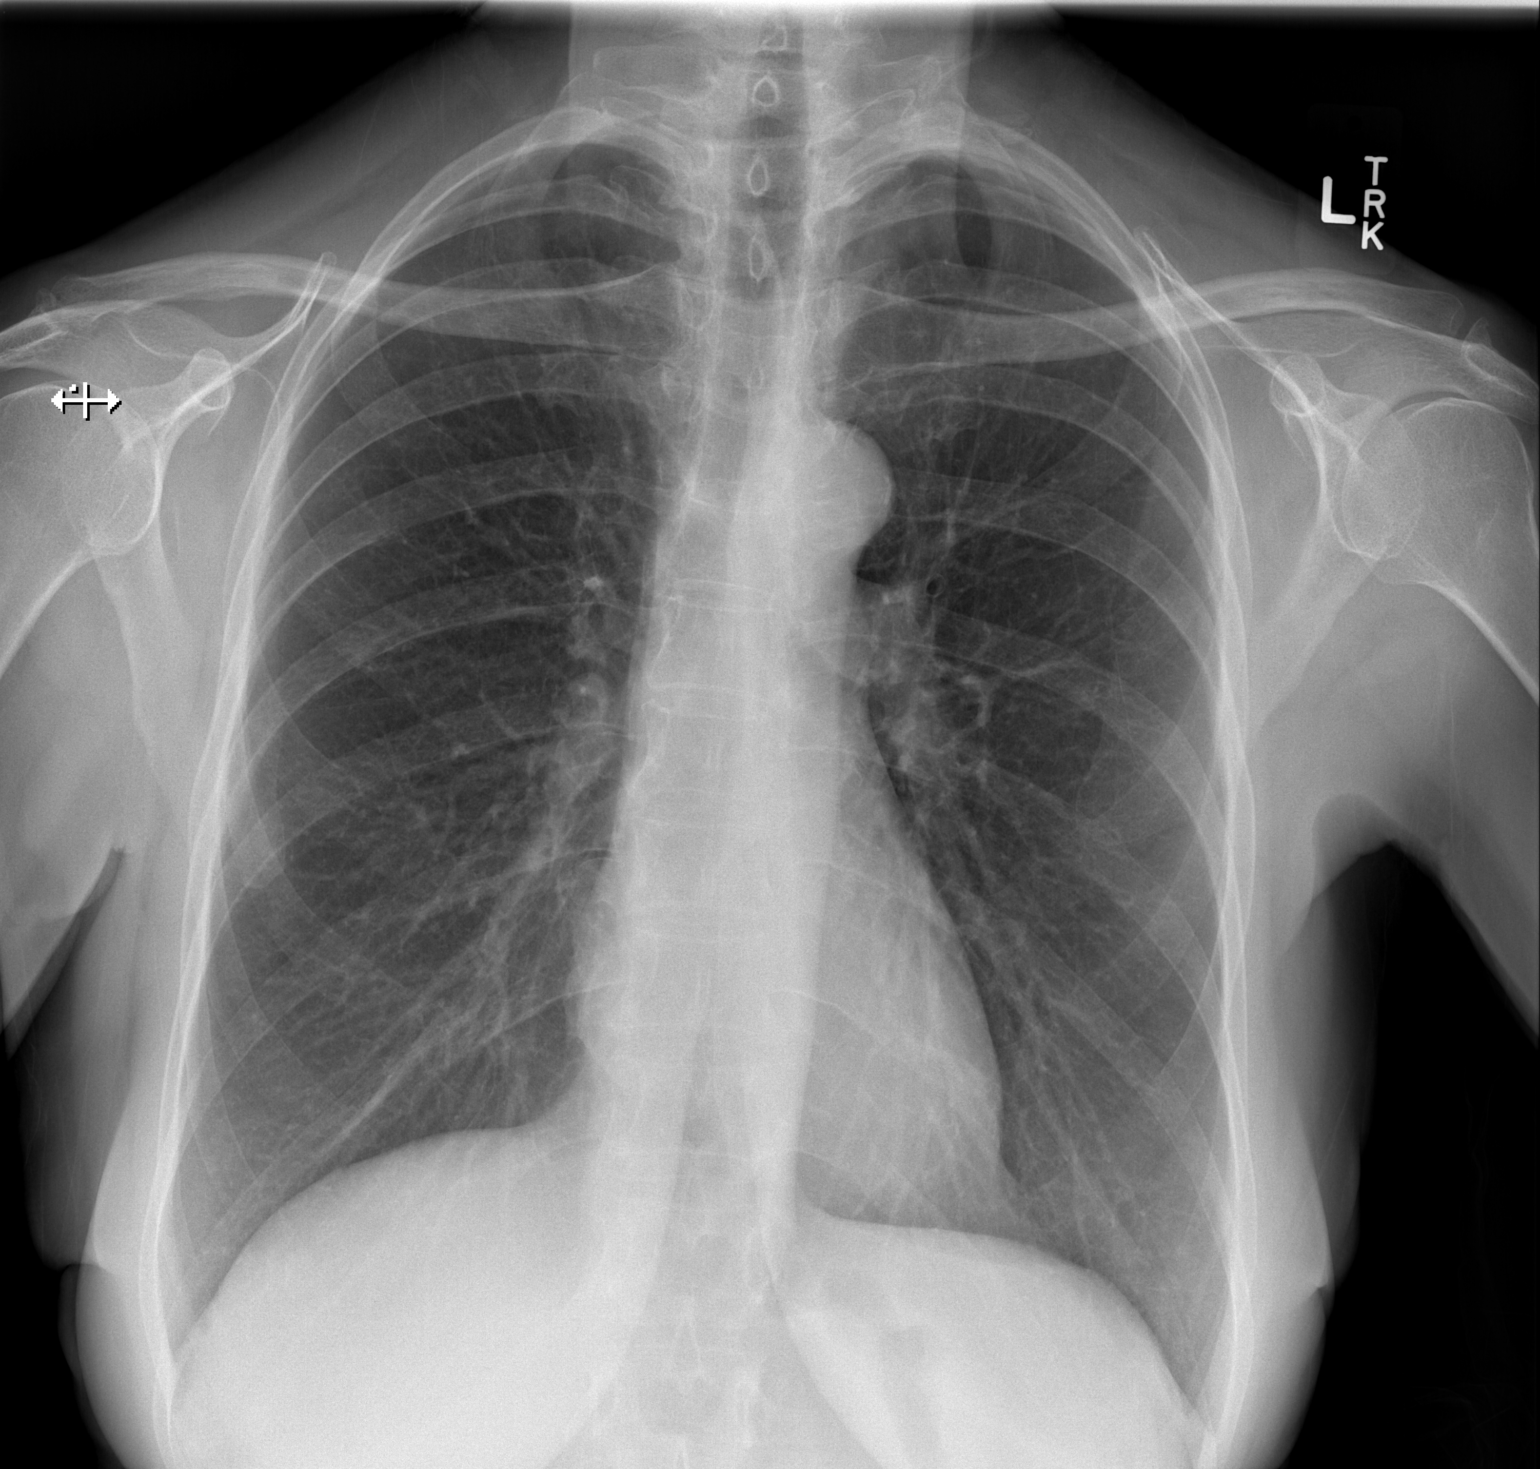

[w chest lat]
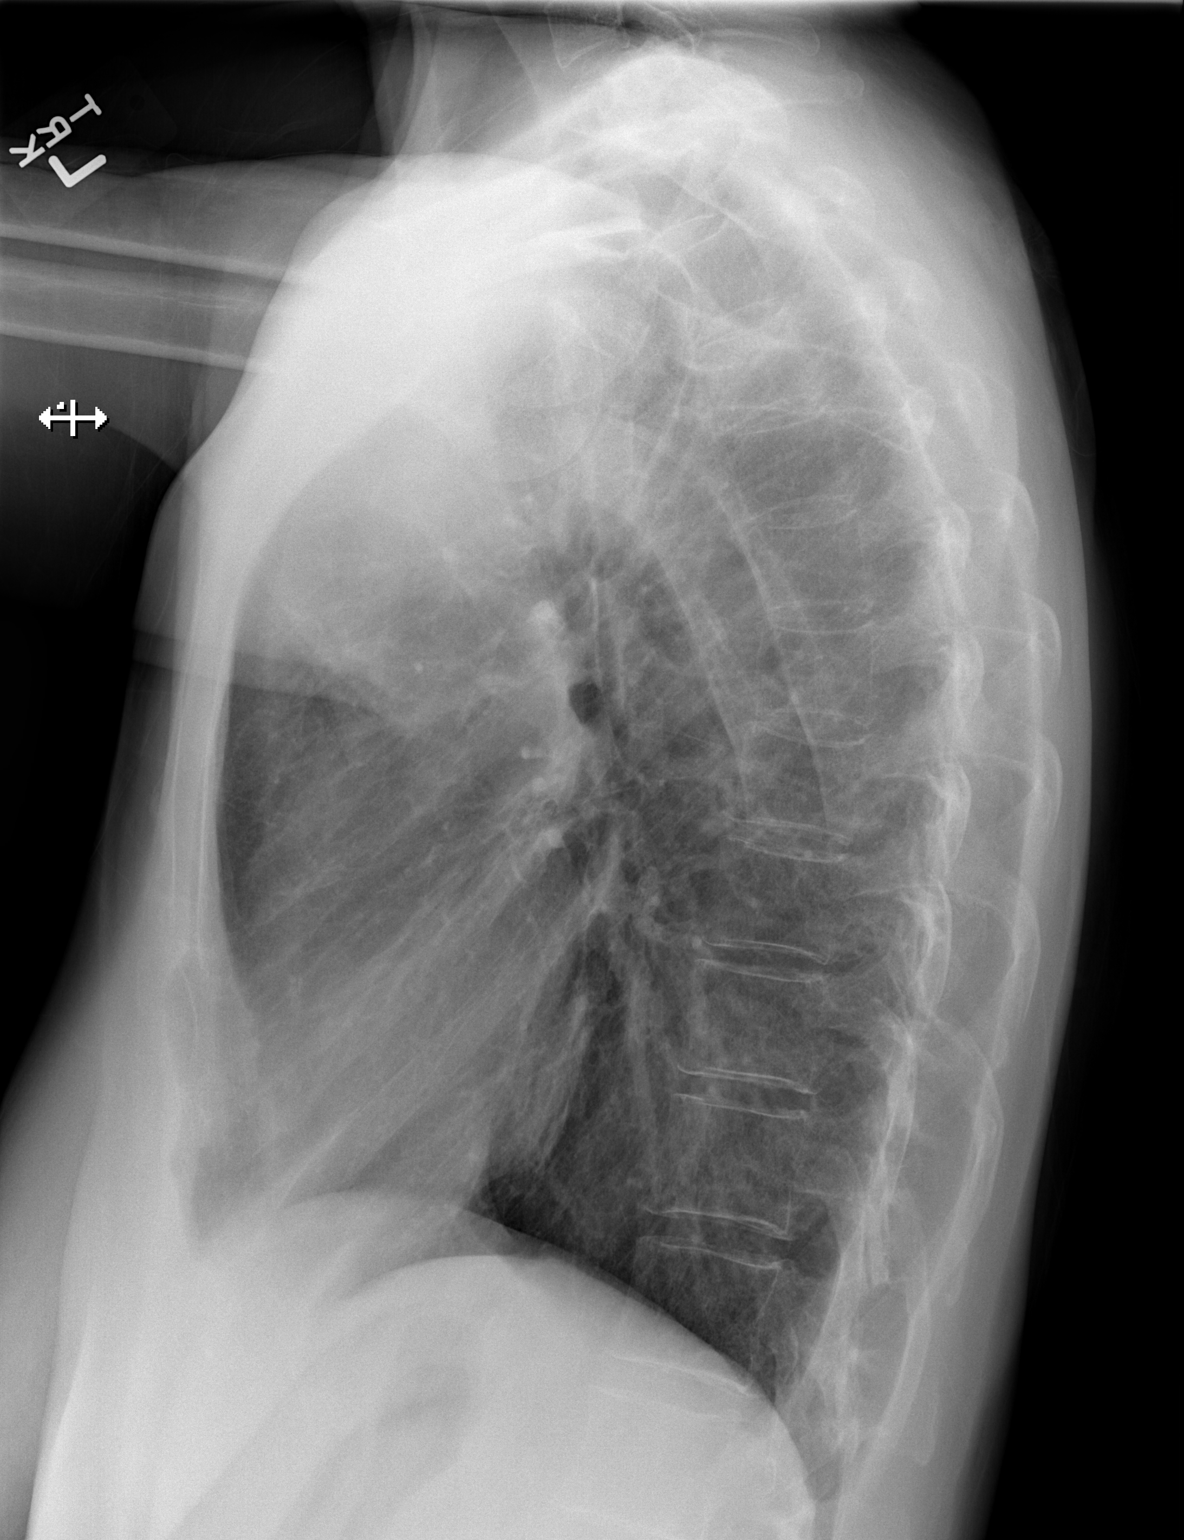

[2 of 2 positions shown; findings below may reference images not displayed]

FINDINGS: Lungs are clear.  The heart size and pulmonary
vascularity are within normal limits.  No adenopathy.  No bone
lesions.
IMPRESSION: No abnormality noted.

## 2014-09-19 ENCOUNTER — Other Ambulatory Visit: Payer: Self-pay

## 2014-09-22 ENCOUNTER — Telehealth: Payer: Self-pay | Admitting: Cardiology

## 2014-09-22 NOTE — Telephone Encounter (Signed)
Returned call to patient she stated she wanted Dr.Jordan to know she has started taking 2 natural medications she found on intranet that supports cardiovascular health,helps lower B/P.Stated she is taking cardio-relax daily and myocor balance daily.Stated she does not like to take B/P, but she knows B/P is good.Stated she had to decrease tribenzor to 20-5-12.5 mg 1/4 tablet daily.40-5-12.5 mg too strong.Stated she feels better,more relaxed.She will need a prescription soon for tribenzor 20-5-12.5 mg,but wanted Dr.Jordan to know.Message sent to Dr.Jordan.

## 2014-09-22 NOTE — Telephone Encounter (Signed)
Please call,concerning her Tribenzor.

## 2014-09-29 ENCOUNTER — Other Ambulatory Visit: Payer: Self-pay

## 2014-09-29 MED ORDER — OLMESARTAN-AMLODIPINE-HCTZ 40-5-12.5 MG PO TABS: 1.0000 | ORAL_TABLET | Freq: Every day | ORAL | Status: DC

## 2014-09-29 MED ORDER — OLMESARTAN-AMLODIPINE-HCTZ 20-5-12.5 MG PO TABS: ORAL_TABLET | ORAL | Status: DC

## 2014-09-29 NOTE — Telephone Encounter (Signed)
Received call from patient she stated she received a call from pharmacy stating refill ready for tribenzor 40-5-12.5 mg.She told pharmacy that dose had changed to 20-5-12.5 mg and she did not need refilling at present.She was told to contact office.Advised I will send in correct dose to pharmacy with fill later.

## 2014-09-29 NOTE — Telephone Encounter (Signed)
Returned call to patient no answer.LMTC. 

## 2014-09-29 NOTE — Addendum Note (Signed)
Addended by: Meda KlinefelterPUGH, Lupe Handley JOHNSON D on: 09/29/2014 04:26 PM   Modules accepted: Medications

## 2014-10-19 ENCOUNTER — Encounter: Payer: Self-pay | Admitting: Family

## 2014-10-24 ENCOUNTER — Other Ambulatory Visit: Payer: Self-pay

## 2014-10-24 ENCOUNTER — Ambulatory Visit: Payer: MEDICARE | Admitting: Family

## 2014-10-24 ENCOUNTER — Encounter (HOSPITAL_COMMUNITY): Payer: MEDICARE

## 2014-10-24 MED ORDER — OLMESARTAN-AMLODIPINE-HCTZ 20-5-12.5 MG PO TABS: ORAL_TABLET | ORAL | Status: DC

## 2014-10-25 ENCOUNTER — Other Ambulatory Visit (HOSPITAL_COMMUNITY): Payer: MEDICARE

## 2014-10-25 ENCOUNTER — Ambulatory Visit: Payer: MEDICARE | Admitting: Family

## 2014-12-09 ENCOUNTER — Encounter: Payer: Self-pay | Admitting: Family

## 2014-12-12 ENCOUNTER — Encounter: Payer: Self-pay | Admitting: Family

## 2014-12-12 ENCOUNTER — Ambulatory Visit (INDEPENDENT_AMBULATORY_CARE_PROVIDER_SITE_OTHER): Payer: MEDICARE | Admitting: Family

## 2014-12-12 ENCOUNTER — Ambulatory Visit (HOSPITAL_COMMUNITY)
Admission: RE | Admit: 2014-12-12 | Discharge: 2014-12-12 | Disposition: A | Discharge status: Home / Self Care | Payer: MEDICARE | Source: Ambulatory Visit | Attending: Family | Admitting: Family

## 2014-12-12 VITALS — BP 184/84 | HR 75 | Ht 70.0 in | Wt 146.8 lb

## 2014-12-12 DIAGNOSIS — I6523 Occlusion and stenosis of bilateral carotid arteries: Secondary | ICD-10-CM

## 2014-12-12 DIAGNOSIS — E785 Hyperlipidemia, unspecified: Secondary | ICD-10-CM | POA: Diagnosis not present

## 2014-12-12 DIAGNOSIS — IMO0001 Reserved for inherently not codable concepts without codable children: Secondary | ICD-10-CM

## 2014-12-12 DIAGNOSIS — Z48812 Encounter for surgical aftercare following surgery on the circulatory system: Secondary | ICD-10-CM | POA: Insufficient documentation

## 2014-12-12 DIAGNOSIS — R030 Elevated blood-pressure reading, without diagnosis of hypertension: Secondary | ICD-10-CM | POA: Diagnosis not present

## 2014-12-12 DIAGNOSIS — Z9889 Other specified postprocedural states: Secondary | ICD-10-CM | POA: Diagnosis not present

## 2014-12-12 DIAGNOSIS — I10 Essential (primary) hypertension: Secondary | ICD-10-CM | POA: Diagnosis not present

## 2014-12-12 NOTE — Patient Instructions (Signed)
Stroke Prevention Some medical conditions and behaviors are associated with an increased chance of having a stroke. You may prevent a stroke by making healthy choices and managing medical conditions. HOW CAN I REDUCE MY RISK OF HAVING A STROKE?   Stay physically active. Get at least 30 minutes of activity on most or all days.  Do not smoke. It may also be helpful to avoid exposure to secondhand smoke.  Limit alcohol use. Moderate alcohol use is considered to be:  No more than 2 drinks per day for men.  No more than 1 drink per day for nonpregnant women.  Eat healthy foods. This involves:  Eating 5 or more servings of fruits and vegetables a day.  Making dietary changes that address high blood pressure (hypertension), high cholesterol, diabetes, or obesity.  Manage your cholesterol levels.  Making food choices that are high in fiber and low in saturated fat, trans fat, and cholesterol may control cholesterol levels.  Take any prescribed medicines to control cholesterol as directed by your health care provider.  Manage your diabetes.  Controlling your carbohydrate and sugar intake is recommended to manage diabetes.  Take any prescribed medicines to control diabetes as directed by your health care provider.  Control your hypertension.  Making food choices that are low in salt (sodium), saturated fat, trans fat, and cholesterol is recommended to manage hypertension.  Take any prescribed medicines to control hypertension as directed by your health care provider.  Maintain a healthy weight.  Reducing calorie intake and making food choices that are low in sodium, saturated fat, trans fat, and cholesterol are recommended to manage weight.  Stop drug abuse.  Avoid taking birth control pills.  Talk to your health care provider about the risks of taking birth control pills if you are over 35 years old, smoke, get migraines, or have ever had a blood clot.  Get evaluated for sleep  disorders (sleep apnea).  Talk to your health care provider about getting a sleep evaluation if you snore a lot or have excessive sleepiness.  Take medicines only as directed by your health care provider.  For some people, aspirin or blood thinners (anticoagulants) are helpful in reducing the risk of forming abnormal blood clots that can lead to stroke. If you have the irregular heart rhythm of atrial fibrillation, you should be on a blood thinner unless there is a good reason you cannot take them.  Understand all your medicine instructions.  Make sure that other conditions (such as anemia or atherosclerosis) are addressed. SEEK IMMEDIATE MEDICAL CARE IF:   You have sudden weakness or numbness of the face, arm, or leg, especially on one side of the body.  Your face or eyelid droops to one side.  You have sudden confusion.  You have trouble speaking (aphasia) or understanding.  You have sudden trouble seeing in one or both eyes.  You have sudden trouble walking.  You have dizziness.  You have a loss of balance or coordination.  You have a sudden, severe headache with no known cause.  You have new chest pain or an irregular heartbeat. Any of these symptoms may represent a serious problem that is an emergency. Do not wait to see if the symptoms will go away. Get medical help at once. Call your local emergency services (911 in [place] Do not drive yourself to the hospital. Document Released: 04/18/2004 Document Revised: 07/26/2013 Document Reviewed: 09/11/2012 Exit[employer] Patient Information 2015 [employer]. This information is not intended to replace advice given   to you by your health care provider. Make sure you discuss any questions you have with your health care provider.  

## 2014-12-12 NOTE — Progress Notes (Signed)
Established Carotid Patient   History of Present Illness  Ashley Berg is a 69 y.o. female patient of Dr. Hart Rochester who returns for continued followup regarding her left carotid endarterectomy July , 2014. Patient was asymptomatic preoperatively and remains asymptomatic. She has no complaints including lateralizing weakness, aphasia, procedures, diplopia, blurred vision, or syncope. She also denies any significant claudication symptoms in her lower extremities. She has no history of coronary artery disease. She does have a history of atrial fibrillation and was treated with digoxin and is followed by Dr. Peter Swaziland. She takes daily aspirin.   Pt states she has an appointment next week to see Dr. Swaziland.  Pt states her blood pressure increases in a doctor office but does not check her blood pressure at home. The patient denies New Medical or Surgical History.  Pt Diabetic: no Pt smoker: non-smoker, but she was exposed to her husband's second hand smoke for 32 years. He died in 07/28/2007.  Pt meds include: Statin : no, on Red Yeast Rice and polycosanol ASA: yes Other anticoagulants/antiplatelets: no   Past Medical History  Diagnosis Date  . PAF (paroxysmal atrial fibrillation)   . Hypothyroidism     Refuses conventional medicine  . Hypertension   . Seasonal allergies   . Carotid artery occlusion   . Hyperlipidemia 08/17/2013    Social History Social History  Substance Use Topics  . Smoking status: Never Smoker   . Smokeless tobacco: Never Used  . Alcohol Use: No    Family History Family History  Problem Relation Age of Onset  . Stroke Father   . Alzheimer's disease Mother     Surgical History Past Surgical History  Procedure Laterality Date  . Appendectomy    . Tubal ligation    . External ear surgery Right 1983    tympanoplasty with patch  . Doppler echocardiography  03/02/1999    NORMAL LEFT VENTRICULAR SIZE. EF 55%  . Cervical polypectomy    . Endarterectomy Left  09/24/2012    Procedure: ENDARTERECTOMY CAROTID;  Surgeon: Pryor Ochoa, MD;  Location: [facility] OR;  Service: Vascular;  Laterality: Left;  . Patch angioplasty Left 09/24/2012    Procedure: PATCH ANGIOPLASTY;  Surgeon: Pryor Ochoa, MD;  Location: [facility] OR;  Service: Vascular;  Laterality: Left;  . Carotid endarterectomy      Allergies  Allergen Reactions  . Broccoli [Brassica Oleracea Italica]     GI upset  . Chicken Protein     Does not eat meat  . Milk-Related Compounds     GI upset  . Mushroom Extract Complex     GI upset  . Onion     GI upset  . Penicillins Other (See Comments)    "skin crawls"    Current Outpatient Prescriptions  Medication Sig Dispense Refill  . aspirin EC 81 MG tablet Take 1 tablet (81 mg total) by mouth daily. 30 tablet 11  . b complex vitamins tablet Take 1 tablet by mouth daily.     . digoxin (LANOXIN) 0.25 MG tablet Take 1 tablet (250 mcg total) by mouth daily. 90 tablet 3  . Glucosamine HCl (GLUCOSAMINE PO) Take 2 tablets by mouth daily.     . Homeopathic Products (SIMILASAN DRY EYE RELIEF) SOLN Place 1 drop into both eyes as needed (dry eyes).    [name] levothyroxine (SYNTHROID) 100 MCG tablet Take 1 tablet (100 mcg total) by mouth daily before breakfast. 90 tablet 3  . Magnesium 400 MG CAPS Take 1  capsule by mouth 2 (two) times daily.    . metoprolol succinate (TOPROL XL) 25 MG 24 hr tablet Take 0.5 tablets (12.5 mg total) by mouth daily. 90 tablet 3  . Olmesartan-Amlodipine-HCTZ (TRIBENZOR) 20-5-12.5 MG TABS Take 1 tablet daily 90 tablet 3  . OVER THE COUNTER MEDICATION Take 1 tablet by mouth daily. Natural thyroid support    . OVER THE COUNTER MEDICATION Take 1 capsule by mouth daily. polycosanol natural supplement    . Red Yeast Rice Extract (RED YEAST RICE PO) Take 1 capsule by mouth daily.    . vitamin E 400 UNIT capsule Take 400 Units by mouth every other day.     . [DISCONTINUED] hydrochlorothiazide (MICROZIDE) 12.5 MG capsule Take 1 capsule (12.5 mg  total) by mouth daily. 30 capsule 5   No current facility-administered medications for this visit.    Review of Systems : See HPI for pertinent positives and negatives.  Physical Examination  Filed Vitals:   12/12/14 1446 12/12/14 1448  BP: 183/87 190/90  Pulse: 75   Height:  (1.778 m)   Weight: 146 lb 12.8 oz (66.588 kg)   SpO2: 100%    Body mass index is 21.06 kg/(m^2).  General: WDWN female in NAD GAIT: normal Eyes: PERRLA Pulmonary:  Non-labored, CTAB, no rales, no rhonchi, & no wheezing.  Cardiac: regular rhythm,  no detected murmur.  VASCULAR EXAM Carotid Bruits Right Left   Negative Negative    Aorta is not palpable. Radial pulses are 2+ palpable and equal.                                                                                                                            LE Pulses Right Left       POPLITEAL  not palpable   not palpable       POSTERIOR TIBIAL  2+ palpable   2+ palpable        DORSALIS PEDIS      ANTERIOR TIBIAL 2+ palpable  2+ palpable     Gastrointestinal: soft, nontender, BS WNL, no r/g,  no palpable masses.  Musculoskeletal: No muscle atrophy/wasting. M/S 5/5 throughout, extremities without ischemic changes.  Neurologic: A&O X 3; Appropriate Affect, Speech is normal CN 2-12 intact, pain and light touch intact in extremities, Motor exam as listed above.   Non-Invasive Vascular Imaging CAROTID DUPLEX 12/12/2014   CEREBROVASCULAR DUPLEX EVALUATION    INDICATION: Carotid artery disease     PREVIOUS INTERVENTION(S): Left carotid endarterectomy 09/24/2012    DUPLEX EXAM:     RIGHT  LEFT  Peak Systolic Velocities (cm/s) End Diastolic Velocities (cm/s) Plaque LOCATION Peak Systolic Velocities (cm/s) End Diastolic Velocities (cm/s) Plaque  121 20  CCA PROXIMAL 121 19   132 24  CCA MID 116 18 HT  100 17  CCA DISTAL 123 21   133 10  ECA 132 13   132 27 HT ICA PROXIMAL 83 10   142 25  ICA MID 104 24   72 21  ICA DISTAL  93 20     1.07 ICA / CCA Ratio (PSV) 0.89  Antegrade  Vertebral Flow Antegrade   190 Brachial Systolic Pressure (mmHg) 188  Multiphasic (Subclavian artery) Brachial Artery Waveforms Multiphasic (Subclavian artery)    Plaque Morphology:  HM = Homogeneous, HT = Heterogeneous, CP = Calcific Plaque, SP = Smooth Plaque, IP = Irregular Plaque     ADDITIONAL FINDINGS:     IMPRESSION: Right internal carotid artery velocities suggest a <40% stenosis.  Patent left carotid endarterectomy site with no evidence of hyperplasia or restenosis.     Compared to the previous exam:  No significant change in comparison to the last exam on 10/19/2013.     Assessment: Ashley Berg is a 69 y.o. female who is s/p left carotid endarterectomy on 09/24/2012. She has no history of stroke or TIA. Today's carotid duplex suggests minimal right ICA stenosis and a patent left carotid endarterectomy site with no evidence of hyperplasia or restenosis. No significant change in comparison to the last exam on 10/19/2013.    Elevated blood pressure: I advised pt to see her PCP or cardiologist ASAP re this; she has an appointment to see Dr. Swaziland next week.   Plan: Follow-up in 1 year with Carotid Duplex.   I discussed in depth with the patient the nature of atherosclerosis, and emphasized the importance of maximal medical management including strict control of blood pressure, blood glucose, and lipid levels, obtaining regular exercise, and continued cessation of smoking.  The patient is aware that without maximal medical management the underlying atherosclerotic disease process will progress, limiting the benefit of any interventions. The patient was given information about stroke prevention and what symptoms should prompt the patient to seek immediate medical care. Thank you for allowing [place] to participate in this patient's care.  Charisse March, RN, MSN, FNP-C [facility] Office:  [phone]  Clinic Physician: Myra Gianotti  12/12/2014 2:52 PM

## 2014-12-23 ENCOUNTER — Encounter: Payer: Self-pay | Admitting: Cardiology

## 2014-12-23 ENCOUNTER — Ambulatory Visit (INDEPENDENT_AMBULATORY_CARE_PROVIDER_SITE_OTHER): Payer: MEDICARE | Admitting: Cardiology

## 2014-12-23 VITALS — BP 168/78 | HR 64 | Ht 70.5 in | Wt 149.3 lb

## 2014-12-23 DIAGNOSIS — I779 Disorder of arteries and arterioles, unspecified: Secondary | ICD-10-CM | POA: Diagnosis not present

## 2014-12-23 DIAGNOSIS — I739 Peripheral vascular disease, unspecified: Secondary | ICD-10-CM

## 2014-12-23 DIAGNOSIS — E785 Hyperlipidemia, unspecified: Secondary | ICD-10-CM

## 2014-12-23 DIAGNOSIS — I10 Essential (primary) hypertension: Secondary | ICD-10-CM | POA: Diagnosis not present

## 2014-12-23 DIAGNOSIS — I6523 Occlusion and stenosis of bilateral carotid arteries: Secondary | ICD-10-CM

## 2014-12-23 MED ORDER — OLMESARTAN-AMLODIPINE-HCTZ 40-5-12.5 MG PO TABS: 1.0000 | ORAL_TABLET | Freq: Every day | ORAL | Status: DC

## 2014-12-23 NOTE — Progress Notes (Signed)
Ashley Berg Date of Birth: 1945-10-11   History of Present Illness: Ashley Berg is seen back today for follow up HTN. She has a history of HTN and hyperlipidemia. She is s/p left CEA. She has a history of hypothyroidism. She denies any chest pain or SOB. No TIA symptoms. No claudication. She recently started on an herbal remedy. She says this made her so relaxed she decided she didn't need her BP medication and she stopped her Tribenzor. When she was seen in vascular surgery office her BP was high so she decided to go back on it one week ago. She does not take her blood pressure at home because it makes her feel too anxious.  Feeling well.  Current Outpatient Prescriptions on File Prior to Visit  Medication Sig Dispense Refill  . aspirin EC 81 MG tablet Take 1 tablet (81 mg total) by mouth daily. 30 tablet 11  . b complex vitamins tablet Take 1 tablet by mouth daily.     . digoxin (LANOXIN) 0.25 MG tablet Take 1 tablet (250 mcg total) by mouth daily. 90 tablet 3  . Homeopathic Products (SIMILASAN DRY EYE RELIEF) SOLN Place 1 drop into both eyes as needed (dry eyes).    [name] levothyroxine (SYNTHROID) 100 MCG tablet Take 1 tablet (100 mcg total) by mouth daily before breakfast. 90 tablet 3  . Magnesium 400 MG CAPS Take 1 capsule by mouth 2 (two) times daily.    . metoprolol succinate (TOPROL XL) 25 MG 24 hr tablet Take 0.5 tablets (12.5 mg total) by mouth daily. 90 tablet 3  . OVER THE COUNTER MEDICATION Take 1 tablet by mouth daily. Natural thyroid support    . OVER THE COUNTER MEDICATION Take 1 capsule by mouth daily. polycosanol natural supplement    . POTASSIUM PO Take by mouth.    . Red Yeast Rice Extract (RED YEAST RICE PO) Take 1 capsule by mouth daily.    . [DISCONTINUED] hydrochlorothiazide (MICROZIDE) 12.5 MG capsule Take 1 capsule (12.5 mg total) by mouth daily. 30 capsule 5   No current facility-administered medications on file prior to visit.    Allergies  Allergen Reactions  .  Broccoli [Brassica Oleracea Italica]     GI upset  . Chicken Protein     Does not eat meat  . Milk-Related Compounds     GI upset  . Mushroom Extract Complex     GI upset  . Onion     GI upset  . Penicillins Other (See Comments)    "skin crawls"    Past Medical History  Diagnosis Date  . PAF (paroxysmal atrial fibrillation)   . Hypothyroidism     Refuses conventional medicine  . Hypertension   . Seasonal allergies   . Carotid artery occlusion   . Hyperlipidemia 08/17/2013    Past Surgical History  Procedure Laterality Date  . Appendectomy    . Tubal ligation    . External ear surgery Right 1983    tympanoplasty with patch  . Doppler echocardiography  03/02/1999    NORMAL LEFT VENTRICULAR SIZE. EF 55%  . Cervical polypectomy    . Endarterectomy Left 09/24/2012    Procedure: ENDARTERECTOMY CAROTID;  Surgeon: Pryor Ochoa, MD;  Location: [facility] OR;  Service: Vascular;  Laterality: Left;  . Patch angioplasty Left 09/24/2012    Procedure: PATCH ANGIOPLASTY;  Surgeon: Pryor Ochoa, MD;  Location: [facility] OR;  Service: Vascular;  Laterality: Left;  . Carotid endarterectomy  History  Smoking status  . Never Smoker   Smokeless tobacco  . Never Used    History  Alcohol Use No    Family History  Problem Relation Age of Onset  . Stroke Father   . Alzheimer's disease Mother     Review of Systems: As noted in HPI.   All other systems were reviewed and are negative.  Physical Exam: BP 168/78 mmHg  Pulse 64  Ht 5' 10.5" (1.791 m)  Wt 67.722 kg (149 lb 4.8 oz)  BMI 21.11 kg/m2 Patient is pleasant and in no acute distress.  Skin is warm and dry. Color is normal.  HEENT is unremarkable except she appears to have exophthalmus. Normocephalic/atraumatic. PERRL. Sclera are nonicteric. Neck is supple. No masses. No JVD. Left CEA scar has healed well.Lungs are clear. Cardiac exam shows a regular rate and rhythm. Abdomen is soft. Extremities are without edema. Gait and ROM are  intact. No gross neurologic deficits noted.  LABORATORY DATA:    Lab Results  Component Value Date   WBC 5.5 09/25/2012   HGB 11.8* 09/25/2012   HCT 34.8* 09/25/2012   PLT 145* 09/25/2012   GLUCOSE 90 07/01/2014   CHOL 173 07/01/2014   TRIG 122 07/01/2014   HDL 46 07/01/2014   LDLDIRECT 144.4 09/08/2012   LDLCALC 103* 07/01/2014   ALT 20 07/01/2014   AST 28 07/01/2014   NA 139 07/01/2014   K 4.1 07/01/2014   CL 102 07/01/2014   CREATININE 0.70 07/01/2014   BUN 17 07/01/2014   CO2 29 07/01/2014   TSH 3.706 07/01/2014   INR 1.00 09/22/2012     Assessment / Plan: 1. Carotid arterial disease s/p left CEA. Stressed the importance of risk factor modification. Continue ASA.  2. HTN - continue Tribenzor to 40/5/12.5 mg daily and  Toprol XL 12.5 mg daily. She is tolerating this well.    3. Hyperlipidemia. Opposed to taking statin. On red yeast rice. Lipids are fair  4. Paroxysmal AFib no symptomatic recurrence.  5. Hypothyroidism on synthroid. TSH is normal.

## 2014-12-23 NOTE — Patient Instructions (Signed)
Continue your current therapy  I will see you in 6 months.   

## 2015-02-22 ENCOUNTER — Other Ambulatory Visit: Payer: Self-pay

## 2015-02-22 ENCOUNTER — Telehealth: Payer: Self-pay | Admitting: Cardiology

## 2015-02-22 MED ORDER — METOPROLOL SUCCINATE ER 25 MG PO TB24: 12.5000 mg | ORAL_TABLET | Freq: Every day | ORAL | Status: DC

## 2015-02-22 NOTE — Telephone Encounter (Signed)
Returned call to patient no answer.LMTC. 

## 2015-02-22 NOTE — Telephone Encounter (Signed)
Returned call to patient 90 day refill for metoprolol sent to pharmacy.

## 2015-02-22 NOTE — Telephone Encounter (Signed)
Calling about her Metoprolol and is saying that she need a new prescription for the medication . Please call   Thanks

## 2015-04-07 ENCOUNTER — Telehealth: Payer: Self-pay | Admitting: Cardiology

## 2015-04-07 NOTE — Telephone Encounter (Signed)
Pt is returning your call. Thanks

## 2015-04-07 NOTE — Telephone Encounter (Signed)
Returned call to patient no answer.LMTC. 

## 2015-04-07 NOTE — Telephone Encounter (Signed)
Please call today,concerning her blood pressure medicine.

## 2015-04-07 NOTE — Telephone Encounter (Signed)
Returned call to patient.She stated refill for tribenzor already taken care of.Stated she received generic.Advised to monitor B/P,but should be ok.

## 2015-04-10 ENCOUNTER — Other Ambulatory Visit: Payer: Self-pay | Admitting: *Deleted

## 2015-04-10 MED ORDER — OLMESARTAN-AMLODIPINE-HCTZ 40-5-12.5 MG PO TABS: 1.0000 | ORAL_TABLET | Freq: Every day | ORAL | Status: DC

## 2015-07-03 ENCOUNTER — Encounter: Payer: Self-pay | Admitting: Cardiology

## 2015-07-03 ENCOUNTER — Ambulatory Visit (INDEPENDENT_AMBULATORY_CARE_PROVIDER_SITE_OTHER): Payer: MEDICARE | Admitting: Cardiology

## 2015-07-03 VITALS — BP 176/80 | HR 60 | Ht 70.5 in | Wt 148.8 lb

## 2015-07-03 DIAGNOSIS — E039 Hypothyroidism, unspecified: Secondary | ICD-10-CM | POA: Diagnosis not present

## 2015-07-03 DIAGNOSIS — E785 Hyperlipidemia, unspecified: Secondary | ICD-10-CM | POA: Diagnosis not present

## 2015-07-03 DIAGNOSIS — I739 Peripheral vascular disease, unspecified: Principal | ICD-10-CM

## 2015-07-03 DIAGNOSIS — I10 Essential (primary) hypertension: Secondary | ICD-10-CM | POA: Diagnosis not present

## 2015-07-03 DIAGNOSIS — I779 Disorder of arteries and arterioles, unspecified: Secondary | ICD-10-CM | POA: Diagnosis not present

## 2015-07-03 NOTE — Progress Notes (Signed)
Ashley OchsJudy L Berg Date of Birth: [birth year]   History of Present Illness: Ashley HongJudy is seen back today for follow up HTN. She has a history of HTN and hyperlipidemia. She is s/p left CEA. She has a history of hypothyroidism.   She denies any chest pain or SOB. No TIA symptoms. No claudication. She remains on Tribenzor and metoprolol for BP.   She does not take her blood pressure at home because it makes her feel too anxious.  Feeling well. Rare leg cramp.  Current Outpatient Prescriptions on File Prior to Visit  Medication Sig Dispense Refill  . aspirin EC 81 MG tablet Take 1 tablet (81 mg total) by mouth daily. 30 tablet 11  . b complex vitamins tablet Take 1 tablet by mouth daily.     . digoxin (LANOXIN) 0.25 MG tablet Take 1 tablet (250 mcg total) by mouth daily. 90 tablet 3  . Homeopathic Products (SIMILASAN DRY EYE RELIEF) SOLN Place 1 drop into both eyes as needed (dry eyes).    [name]. levothyroxine (SYNTHROID) 100 MCG tablet Take 1 tablet (100 mcg total) by mouth daily before breakfast. 90 tablet 3  . Magnesium 400 MG CAPS Take 1 capsule by mouth 2 (two) times daily.    . metoprolol succinate (TOPROL XL) 25 MG 24 hr tablet Take 0.5 tablets (12.5 mg total) by mouth daily. 45 tablet 3  . Olmesartan-Amlodipine-HCTZ (TRIBENZOR) 40-5-12.5 MG TABS Take 1 tablet by mouth daily. 30 tablet 6  . OVER THE COUNTER MEDICATION Take 1 tablet by mouth daily. Natural thyroid support    . OVER THE COUNTER MEDICATION Take 1 capsule by mouth daily. polycosanol natural supplement    . POTASSIUM PO Take by mouth.    . Red Yeast Rice Extract (RED YEAST RICE PO) Take 1 capsule by mouth daily.    . [DISCONTINUED] hydrochlorothiazide (MICROZIDE) 12.5 MG capsule Take 1 capsule (12.5 mg total) by mouth daily. 30 capsule 5   No current facility-administered medications on file prior to visit.    Allergies  Allergen Reactions  . Broccoli [Brassica Oleracea Italica]     GI upset  . Chicken Protein     Does not eat  meat  . Milk-Related Compounds     GI upset  . Mushroom Extract Complex     GI upset  . Onion     GI upset  . Penicillins Other (See Comments)    "skin crawls"    Past Medical History  Diagnosis Date  . PAF (paroxysmal atrial fibrillation) (HCC)   . Hypothyroidism     Refuses conventional medicine  . Hypertension   . Seasonal allergies   . Carotid artery occlusion   . Hyperlipidemia 08/17/2013    Past Surgical History  Procedure Laterality Date  . Appendectomy    . Tubal ligation    . External ear surgery Right 1983    tympanoplasty with patch  . Doppler echocardiography  03/02/1999    NORMAL LEFT VENTRICULAR SIZE. EF 55%  . Cervical polypectomy    . Endarterectomy Left 09/24/2012    Procedure: ENDARTERECTOMY CAROTID;  Surgeon: Pryor OchoaJames D Lawson, MD;  [facility];  Service: Vascular;  Laterality: Left;  . Patch angioplasty Left 09/24/2012    Procedure: PATCH ANGIOPLASTY;  Surgeon: Pryor OchoaJames D Lawson, MD;  [facility];  Service: Vascular;  Laterality: Left;  . Carotid endarterectomy      History  Smoking status  . Never Smoker   Smokeless tobacco  . Never Used  History  Alcohol Use No    Family History  Problem Relation Age of Onset  . Stroke Father   . Alzheimer's disease Mother     Review of Systems: As noted in HPI.   All other systems were reviewed and are negative.  Physical Exam: BP 176/80 mmHg  Pulse 60  Ht 5' 10.5" (1.791 m)  Wt 67.495 kg (148 lb 12.8 oz)  BMI 21.04 kg/m2  SpO2 99% Patient is pleasant and in no acute distress.  Skin is warm and dry. Color is normal.  HEENT is unremarkable except she appears to have exophthalmus. Normocephalic/atraumatic. PERRL. Sclera are nonicteric. Neck is supple. No masses. No JVD. Left CEA scar has healed well.Lungs are clear. Cardiac exam shows a regular rate and rhythm. Abdomen is soft. Extremities are without edema. Gait and ROM are intact. No gross neurologic deficits noted.  LABORATORY DATA:    Lab  Results  Component Value Date   WBC 5.5 09/25/2012   HGB 11.8* 09/25/2012   HCT 34.8* 09/25/2012   PLT 145* 09/25/2012   GLUCOSE 90 07/01/2014   CHOL 173 07/01/2014   TRIG 122 07/01/2014   HDL 46 07/01/2014   LDLDIRECT 144.4 09/08/2012   LDLCALC 103* 07/01/2014   ALT 20 07/01/2014   AST 28 07/01/2014   NA 139 07/01/2014   K 4.1 07/01/2014   CL 102 07/01/2014   CREATININE 0.70 07/01/2014   BUN 17 07/01/2014   CO2 29 07/01/2014   TSH 3.706 07/01/2014   INR 1.00 09/22/2012     Assessment / Plan: 1. Carotid arterial disease s/p left CEA. Stressed the importance of risk factor modification. Continue ASA. Dopplers in September 2016 were good.  2. HTN - continue Tribenzor to 40/5/12.5 mg daily and  Toprol XL 12.5 mg daily. She is tolerating this well.  BP is not ideal but does fluctuate.  3. Hyperlipidemia. Opposed to taking statin. On red yeast rice. Lipids are fair  4. Paroxysmal AFib no symptomatic recurrence.  5. Hypothyroidism on synthroid.   6. Hypercalcemia. Review of labs show persistent elevation of calcium in 10.6-10.7 range. Will check parathyroid level with remainder of labs today including chemistries, TSH, lipids, and CBC.  Follow up in 6 months.

## 2015-07-03 NOTE — Patient Instructions (Signed)
We will check lab work today  Continue your current therapy

## 2015-07-04 LAB — BASIC METABOLIC PANEL
BUN: 22 mg/dL (ref 7–25)
CHLORIDE: 103 mmol/L (ref 98–110)
CO2: 30 mmol/L (ref 20–31)
Calcium: 10.4 mg/dL (ref 8.6–10.4)
Creat: 0.75 mg/dL (ref 0.60–0.93)
GLUCOSE: 94 mg/dL (ref 65–99)
POTASSIUM: 4.4 mmol/L (ref 3.5–5.3)
Sodium: 140 mmol/L (ref 135–146)

## 2015-07-04 LAB — HEPATIC FUNCTION PANEL
ALT: 28 U/L (ref 6–29)
AST: 28 U/L (ref 10–35)
Albumin: 4.5 g/dL (ref 3.6–5.1)
Alkaline Phosphatase: 67 U/L (ref 33–130)
BILIRUBIN DIRECT: 0.1 mg/dL (ref <=0.2)
BILIRUBIN INDIRECT: 0.7 mg/dL (ref 0.2–1.2)
BILIRUBIN TOTAL: 0.8 mg/dL (ref 0.2–1.2)
Total Protein: 6.9 g/dL (ref 6.1–8.1)

## 2015-07-04 LAB — LIPID PANEL
Cholesterol: 190 mg/dL (ref 125–200)
HDL: 56 mg/dL (ref >=46)
LDL CALC: 117 mg/dL (ref <130)
Total CHOL/HDL Ratio: 3.4 Ratio (ref <=5.0)
Triglycerides: 85 mg/dL (ref <150)
VLDL: 17 mg/dL (ref <30)

## 2015-07-04 LAB — TSH: TSH: 3.7 mIU/L

## 2015-07-05 LAB — CBC WITH DIFFERENTIAL/PLATELET
BASOS PCT: 1 %
Basophils Absolute: 44 cells/uL (ref 0–200)
EOS ABS: 88 {cells}/uL (ref 15–500)
Eosinophils Relative: 2 %
HEMATOCRIT: 41.6 % (ref 35.0–45.0)
Hemoglobin: 14.1 g/dL (ref 11.7–15.5)
LYMPHS ABS: 1144 {cells}/uL (ref 850–3900)
Lymphocytes Relative: 26 %
MCH: 33.7 pg — ABNORMAL HIGH (ref 27.0–33.0)
MCHC: 33.9 g/dL (ref 32.0–36.0)
MCV: 99.5 fL (ref 80.0–100.0)
MONO ABS: 396 {cells}/uL (ref 200–950)
MONOS PCT: 9 %
MPV: 11 fL (ref 7.5–12.5)
NEUTROS ABS: 2728 {cells}/uL (ref 1500–7800)
Neutrophils Relative %: 62 %
PLATELETS: 164 10*3/uL (ref 140–400)
RBC: 4.18 MIL/uL (ref 3.80–5.10)
RDW: 14.2 % (ref 11.0–15.0)
WBC: 4.4 10*3/uL (ref 3.8–10.8)

## 2015-07-05 LAB — PTH, INTACT AND CALCIUM
Calcium: 10.4 mg/dL (ref 8.4–10.5)
PTH: 77 pg/mL — ABNORMAL HIGH (ref 14–64)

## 2015-07-06 ENCOUNTER — Telehealth: Payer: Self-pay | Admitting: Cardiology

## 2015-07-06 NOTE — Telephone Encounter (Signed)
Returned call to patient no answer.LMTC. 

## 2015-07-06 NOTE — Telephone Encounter (Signed)
New message     Patient verbalize has something else for the nurse to discuss - regarding her situation

## 2015-07-24 NOTE — Telephone Encounter (Signed)
Spoke to patient.She stated she forgot to let us know at her last visit with Dr.Jordan she is taking Vit D 5000 IU daily.Advised I will enter in her chart.

## 2015-08-07 ENCOUNTER — Other Ambulatory Visit: Payer: Self-pay | Admitting: *Deleted

## 2015-08-07 MED ORDER — DIGOXIN 250 MCG PO TABS: 250.0000 ug | ORAL_TABLET | Freq: Every day | ORAL | Status: DC

## 2015-11-11 ENCOUNTER — Other Ambulatory Visit: Payer: Self-pay | Admitting: Cardiology

## 2015-12-12 ENCOUNTER — Encounter: Payer: Self-pay | Admitting: Family

## 2015-12-19 ENCOUNTER — Encounter: Payer: Self-pay | Admitting: Family

## 2015-12-19 ENCOUNTER — Ambulatory Visit (HOSPITAL_COMMUNITY)
Admission: RE | Admit: 2015-12-19 | Discharge: 2015-12-19 | Disposition: A | Discharge status: Home / Self Care | Payer: MEDICARE | Source: Ambulatory Visit | Attending: Family | Admitting: Family

## 2015-12-19 ENCOUNTER — Ambulatory Visit (INDEPENDENT_AMBULATORY_CARE_PROVIDER_SITE_OTHER): Payer: MEDICARE | Admitting: Family

## 2015-12-19 VITALS — BP 150/80 | HR 75 | Temp 98.7°F | Resp 16 | Ht 70.5 in | Wt 148.0 lb

## 2015-12-19 DIAGNOSIS — I6521 Occlusion and stenosis of right carotid artery: Secondary | ICD-10-CM | POA: Insufficient documentation

## 2015-12-19 DIAGNOSIS — I6522 Occlusion and stenosis of left carotid artery: Secondary | ICD-10-CM | POA: Diagnosis not present

## 2015-12-19 DIAGNOSIS — Z9889 Other specified postprocedural states: Secondary | ICD-10-CM | POA: Diagnosis not present

## 2015-12-19 DIAGNOSIS — Z48812 Encounter for surgical aftercare following surgery on the circulatory system: Secondary | ICD-10-CM | POA: Diagnosis not present

## 2015-12-19 NOTE — Patient Instructions (Signed)
Stroke Prevention Some medical conditions and behaviors are associated with an increased chance of having a stroke. You may prevent a stroke by making healthy choices and managing medical conditions. HOW CAN I REDUCE MY RISK OF HAVING A STROKE?   Stay physically active. Get at least 30 minutes of activity on most or all days.  Do not smoke. It may also be helpful to avoid exposure to secondhand smoke.  Limit alcohol use. Moderate alcohol use is considered to be:  No more than 2 drinks per day for men.  No more than 1 drink per day for nonpregnant women.  Eat healthy foods. This involves:  Eating 5 or more servings of fruits and vegetables a day.  Making dietary changes that address high blood pressure (hypertension), high cholesterol, diabetes, or obesity.  Manage your cholesterol levels.  Making food choices that are high in fiber and low in saturated fat, trans fat, and cholesterol may control cholesterol levels.  Take any prescribed medicines to control cholesterol as directed by your health care provider.  Manage your diabetes.  Controlling your carbohydrate and sugar intake is recommended to manage diabetes.  Take any prescribed medicines to control diabetes as directed by your health care provider.  Control your hypertension.  Making food choices that are low in salt (sodium), saturated fat, trans fat, and cholesterol is recommended to manage hypertension.  Ask your health care provider if you need treatment to lower your blood pressure. Take any prescribed medicines to control hypertension as directed by your health care provider.  If you are 18-39 years of age, have your blood pressure checked every 3-5 years. If you are 40 years of age or older, have your blood pressure checked every year.  Maintain a healthy weight.  Reducing calorie intake and making food choices that are low in sodium, saturated fat, trans fat, and cholesterol are recommended to manage  weight.  Stop drug abuse.  Avoid taking birth control pills.  Talk to your health care provider about the risks of taking birth control pills if you are over 35 years old, smoke, get migraines, or have ever had a blood clot.  Get evaluated for sleep disorders (sleep apnea).  Talk to your health care provider about getting a sleep evaluation if you snore a lot or have excessive sleepiness.  Take medicines only as directed by your health care provider.  For some people, aspirin or blood thinners (anticoagulants) are helpful in reducing the risk of forming abnormal blood clots that can lead to stroke. If you have the irregular heart rhythm of atrial fibrillation, you should be on a blood thinner unless there is a good reason you cannot take them.  Understand all your medicine instructions.  Make sure that other conditions (such as anemia or atherosclerosis) are addressed. SEEK IMMEDIATE MEDICAL CARE IF:   You have sudden weakness or numbness of the face, arm, or leg, especially on one side of the body.  Your face or eyelid droops to one side.  You have sudden confusion.  You have trouble speaking (aphasia) or understanding.  You have sudden trouble seeing in one or both eyes.  You have sudden trouble walking.  You have dizziness.  You have a loss of balance or coordination.  You have a sudden, severe headache with no known cause.  You have new chest pain or an irregular heartbeat. Any of these symptoms may represent a serious problem that is an emergency. Do not wait to see if the symptoms will   go away. Get medical help at once. Call your local emergency services (911 in [place] Do not drive yourself to the hospital.   This information is not intended to replace advice given to you by your health care provider. Make sure you discuss any questions you have with your health care provider.   Document Released: 04/18/2004 Document Revised: 04/01/2014 Document Reviewed:  09/11/2012 Elsevier Interactive Patient Education 2016 Elsevier Inc.  

## 2015-12-19 NOTE — Progress Notes (Signed)
Chief Complaint: Follow up Extracranial Carotid Artery Stenosis   History of Present Illness  Ashley Berg is a 70 y.o. female patient of Dr. Hart RochesterLawson who returns for continued followup regarding her left carotid endarterectomy July , 2014. Patient was asymptomatic preoperatively and remains asymptomatic. She has no complaints including lateralizing weakness, aphasia, procedures, diplopia, blurred vision, or syncope. She also denies any significant claudication symptoms in her lower extremities. She has no history of coronary artery disease. She does have a history of atrial fibrillation and was treated with digoxin and is followed by Dr. Peter SwazilandJordan. She takes daily aspirin.   Pt states her blood pressure increases in a doctor office but does not check her blood pressure at home. The patient denies New Medical or Surgical History.  Pt Diabetic: no Pt smoker: non-smoker, but she was exposed to her husband's second hand smoke for 32 years. He died in 2009.  Pt meds include: Statin : no, on Red Yeast Rice and polycosanol ASA: yes Other anticoagulants/antiplatelets: no   Past Medical History:  Diagnosis Date  . Carotid artery occlusion   . Hyperlipidemia 08/17/2013  . Hypertension   . Hypothyroidism    Refuses conventional medicine  . PAF (paroxysmal atrial fibrillation) (HCC)   . Seasonal allergies     Social History Social History  Substance Use Topics  . Smoking status: Never Smoker  . Smokeless tobacco: Never Used  . Alcohol use No    Family History Family History  Problem Relation Age of Onset  . Stroke Father   . Alzheimer's disease Mother     Surgical History Past Surgical History:  Procedure Laterality Date  . APPENDECTOMY    . CAROTID ENDARTERECTOMY    . CERVICAL POLYPECTOMY    . DOPPLER ECHOCARDIOGRAPHY  03/02/1999   NORMAL LEFT VENTRICULAR SIZE. EF 55%  . ENDARTERECTOMY Left 09/24/2012   Procedure: ENDARTERECTOMY CAROTID;  Surgeon: Pryor OchoaJames D Lawson, MD;   [facility];  Service: Vascular;  Laterality: Left;  . EXTERNAL EAR SURGERY Right 1983   tympanoplasty with patch  . PATCH ANGIOPLASTY Left 09/24/2012   Procedure: PATCH ANGIOPLASTY;  Surgeon: Pryor OchoaJames D Lawson, MD;  [facility];  Service: Vascular;  Laterality: Left;  . TUBAL LIGATION      Allergies  Allergen Reactions  . Broccoli [Brassica Oleracea Italica]     GI upset  . Chicken Protein     Does not eat meat  . Milk-Related Compounds     GI upset  . Mushroom Extract Complex     GI upset  . Onion     GI upset  . Penicillins Other (See Comments)    "skin crawls"    Current Outpatient Prescriptions  Medication Sig Dispense Refill  . aspirin EC 81 MG tablet Take 1 tablet (81 mg total) by mouth daily. 30 tablet 11  . b complex vitamins tablet Take 1 tablet by mouth daily.     . Cholecalciferol ([facility] VIT D 5000 HIGH-POTENCY) 5000 units capsule Take 1 capsule (5,000 Units total) by mouth daily. 30 capsule 0  . digoxin (LANOXIN) 0.25 MG tablet Take 1 tablet (250 mcg total) by mouth daily. 90 tablet 3  . Homeopathic Products (SIMILASAN DRY EYE RELIEF) SOLN Place 1 drop into both eyes as needed (dry eyes).    [name]. levothyroxine (SYNTHROID, LEVOTHROID) 100 MCG tablet TAKE 1 TABLET BY MOUTH DAILY BEFORE BREAKFAST. 90 tablet 3  . Magnesium 400 MG CAPS Take 1 capsule by mouth 2 (two) times  daily.    . metoprolol succinate (TOPROL XL) 25 MG 24 hr tablet Take 0.5 tablets (12.5 mg total) by mouth daily. 45 tablet 3  . NON FORMULARY Primal Lab Cardio Relax AO: Take 1 tab by mouth twice a day    . NON FORMULARY Primal MyoCor Balance: Take 1 tab by mouth twice a day    . Olmesartan-Amlodipine-HCTZ (TRIBENZOR) 40-5-12.5 MG TABS Take 1 tablet by mouth daily. 30 tablet 6  . OVER THE COUNTER MEDICATION Take 1 tablet by mouth daily. Natural thyroid support    . OVER THE COUNTER MEDICATION Take 1 capsule by mouth daily. polycosanol natural supplement    . POTASSIUM PO Take by mouth.    . Red Yeast  Rice Extract (RED YEAST RICE PO) Take 1 capsule by mouth daily.     No current facility-administered medications for this visit.     Review of Systems : See HPI for pertinent positives and negatives.  Physical Examination  Vitals:   12/19/15 1440 12/19/15 1444  BP: 140/80 (!) 150/80  Pulse: 75   Resp: 16   Temp: 98.7 F (37.1 C)   TempSrc: Oral   SpO2: 100%   Weight: 148 lb (67.1 kg)   Height: 5' 10.5" (1.791 m)    Body mass index is 20.94 kg/m.  General: WDWN female in NAD GAIT: normal Eyes: PERRLA Pulmonary:  Non-labored, CTAB, no rales, no rhonchi, & no wheezing.  Cardiac: regular rhythm and rate,  no detected murmur.  VASCULAR EXAM Carotid Bruits Right Left   Negative Negative    Aorta is not palpable. Radial pulses are 2+ palpable and equal.                                                                                                                                          LE Pulses Right Left       POPLITEAL  not palpable  not palpable       POSTERIOR TIBIAL  2+ palpable  2+ palpable       DORSALIS PEDIS      ANTERIOR TIBIAL 2+ palpable 2+ palpable    Gastrointestinal: soft, nontender, BS WNL, no r/g,  no palpable masses.  Musculoskeletal: No muscle atrophy/wasting. M/S 5/5 throughout, extremities without ischemic changes.  Neurologic: A&O X 3; Appropriate Affect, Speech is normal CN 2-12 intact, pain and light touch intact in extremities, Motor exam as listed above.    Assessment: Ashley Berg is a 70 y.o. female who is s/p left carotid endarterectomy on 09/24/2012. She has no history of stroke or TIA.  DATA Today's carotid duplex suggests minimal right ICA stenosis and a patent left carotid endarterectomy site with no evidence of hyperplasia or restenosis. No significant change in comparison to the last exams on 10/19/2013 and 12/12/14.   Plan: Follow-up in 1 year with Carotid Duplex scan.   I discussed in  depth with the patient the  nature of atherosclerosis, and emphasized the importance of maximal medical management including strict control of blood pressure, blood glucose, and lipid levels, obtaining regular exercise, and continued cessation of smoking.  The patient is aware that without maximal medical management the underlying atherosclerotic disease process will progress, limiting the benefit of any interventions. The patient was given information about stroke prevention and what symptoms should prompt the patient to seek immediate medical care. Thank you for allowing [place] to participate in this patient's care.  Charisse March, RN, MSN, FNP-C [facility] Office: [phone]  Clinic Physician: Early  12/19/15 2:59 PM

## 2015-12-29 ENCOUNTER — Telehealth: Payer: Self-pay | Admitting: Cardiology

## 2015-12-29 MED ORDER — OLMESARTAN-AMLODIPINE-HCTZ 40-5-12.5 MG PO TABS: 1.0000 | ORAL_TABLET | Freq: Every day | ORAL | 1 refills | Status: DC

## 2015-12-29 NOTE — Telephone Encounter (Signed)
New message    *STAT* If patient is at the pharmacy, call can be transferred to refill team.   1. Which medications need to be refilled? (please list name of each medication and dose if known) generic for tribenzar 40-5-12.5mg   2. Which pharmacy/location (including street and city if local pharmacy) is medication to be sent to? [facility] [address] [phone]  3. Do they need a 30 day or 90 day supply? 90

## 2016-02-01 ENCOUNTER — Other Ambulatory Visit: Payer: Self-pay

## 2016-02-01 ENCOUNTER — Encounter: Payer: Self-pay | Admitting: Cardiology

## 2016-02-01 ENCOUNTER — Ambulatory Visit (INDEPENDENT_AMBULATORY_CARE_PROVIDER_SITE_OTHER): Payer: MEDICARE | Admitting: Cardiology

## 2016-02-01 VITALS — BP 160/76 | HR 72 | Ht 70.5 in | Wt 150.8 lb

## 2016-02-01 DIAGNOSIS — I779 Disorder of arteries and arterioles, unspecified: Secondary | ICD-10-CM

## 2016-02-01 DIAGNOSIS — I6522 Occlusion and stenosis of left carotid artery: Secondary | ICD-10-CM

## 2016-02-01 DIAGNOSIS — E7800 Pure hypercholesterolemia, unspecified: Secondary | ICD-10-CM | POA: Diagnosis not present

## 2016-02-01 DIAGNOSIS — E213 Hyperparathyroidism, unspecified: Secondary | ICD-10-CM | POA: Diagnosis not present

## 2016-02-01 DIAGNOSIS — I10 Essential (primary) hypertension: Secondary | ICD-10-CM | POA: Diagnosis not present

## 2016-02-01 DIAGNOSIS — I739 Peripheral vascular disease, unspecified: Principal | ICD-10-CM

## 2016-02-01 MED ORDER — METOPROLOL SUCCINATE ER 25 MG PO TB24: 12.5000 mg | ORAL_TABLET | Freq: Every day | ORAL | 3 refills | Status: DC

## 2016-02-01 NOTE — Progress Notes (Signed)
Ashley OchsJudy L Berg Date of Birth: 1945-08-07   History of Present Illness: Ashley HongJudy is seen back today for follow up HTN. She has a history of HTN and hyperlipidemia. She is s/p left CEA. She has a history of hypothyroidism.   She denies any chest pain or SOB. No TIA symptoms. No claudication. She remains on Tribenzor and metoprolol for BP.   She does not take her blood pressure at home because it makes her feel too anxious.  Feeling well.  She had follow up carotid dopplers in September that showed no significant stenosis.   On her last visit she was noted to have a chronically elevated calcium level. PTH level was mildly elevated. No history of renal stones. No fractures. Normal renal function.   Current Outpatient Prescriptions on File Prior to Visit  Medication Sig Dispense Refill  . aspirin EC 81 MG tablet Take 1 tablet (81 mg total) by mouth daily. 30 tablet 11  . b complex vitamins tablet Take 1 tablet by mouth daily.     . Cholecalciferol ([facility]S VIT D 5000 HIGH-POTENCY) 5000 units capsule Take 1 capsule (5,000 Units total) by mouth daily. 30 capsule 0  . digoxin (LANOXIN) 0.25 MG tablet Take 1 tablet (250 mcg total) by mouth daily. 90 tablet 3  . Homeopathic Products (SIMILASAN DRY EYE RELIEF) SOLN Place 1 drop into both eyes as needed (dry eyes).    [name]. levothyroxine (SYNTHROID, LEVOTHROID) 100 MCG tablet TAKE 1 TABLET BY MOUTH DAILY BEFORE BREAKFAST. 90 tablet 3  . Magnesium 400 MG CAPS Take 1 capsule by mouth 2 (two) times daily.    . metoprolol succinate (TOPROL XL) 25 MG 24 hr tablet Take 0.5 tablets (12.5 mg total) by mouth daily. 45 tablet 3  . NON FORMULARY Primal Lab Cardio Relax AO: Take 1 tab by mouth twice a day    . NON FORMULARY Primal MyoCor Balance: Take 1 tab by mouth twice a day    . Olmesartan-Amlodipine-HCTZ (TRIBENZOR) 40-5-12.5 MG TABS Take 1 tablet by mouth daily. 90 tablet 1  . OVER THE COUNTER MEDICATION Take 1 tablet by mouth daily. Natural thyroid support    . OVER  THE COUNTER MEDICATION Take 1 capsule by mouth daily. polycosanol natural supplement    . POTASSIUM PO Take by mouth.    . Red Yeast Rice Extract (RED YEAST RICE PO) Take 1 capsule by mouth daily.    . [DISCONTINUED] hydrochlorothiazide (MICROZIDE) 12.5 MG capsule Take 1 capsule (12.5 mg total) by mouth daily. 30 capsule 5   No current facility-administered medications on file prior to visit.     Allergies  Allergen Reactions  . Broccoli [Brassica Oleracea Italica]     GI upset  . Chicken Protein     Does not eat meat  . Milk-Related Compounds     GI upset  . Mushroom Extract Complex     GI upset  . Onion     GI upset  . Penicillins Other (See Comments)    "skin crawls"    Past Medical History:  Diagnosis Date  . Carotid artery occlusion   . Hyperlipidemia 08/17/2013  . Hypertension   . Hypothyroidism    Refuses conventional medicine  . PAF (paroxysmal atrial fibrillation) (HCC)   . Seasonal allergies     Past Surgical History:  Procedure Laterality Date  . APPENDECTOMY    . CAROTID ENDARTERECTOMY    . CERVICAL POLYPECTOMY    . DOPPLER ECHOCARDIOGRAPHY  03/02/1999  NORMAL LEFT VENTRICULAR SIZE. EF 55%  . ENDARTERECTOMY Left 09/24/2012   Procedure: ENDARTERECTOMY CAROTID;  Surgeon: Pryor OchoaJames D Lawson, MD;  [facility];  Service: Vascular;  Laterality: Left;  . EXTERNAL EAR SURGERY Right 1983   tympanoplasty with patch  . PATCH ANGIOPLASTY Left 09/24/2012   Procedure: PATCH ANGIOPLASTY;  Surgeon: Pryor OchoaJames D Lawson, MD;  [facility];  Service: Vascular;  Laterality: Left;  . TUBAL LIGATION      History  Smoking Status  . Never Smoker  Smokeless Tobacco  . Never Used    History  Alcohol Use No    Family History  Problem Relation Age of Onset  . Stroke Father   . Alzheimer's disease Mother     Review of Systems: As noted in HPI.   All other systems were reviewed and are negative.  Physical Exam: There were no vitals taken for this visit. Patient is  pleasant and in no acute distress.  Skin is warm and dry. Color is normal.  HEENT is unremarkable except she appears to have exophthalmus. Normocephalic/atraumatic. PERRL. Sclera are nonicteric. Neck is supple. No masses. No JVD. Left CEA scar has healed well.Lungs are clear. Cardiac exam shows a regular rate and rhythm. Abdomen is soft. Extremities are without edema. Gait and ROM are intact. No gross neurologic deficits noted.  LABORATORY DATA:    Lab Results  Component Value Date   WBC 4.4 07/03/2015   HGB 14.1 07/03/2015   HCT 41.6 07/03/2015   PLT 164 07/03/2015   GLUCOSE 94 07/03/2015   CHOL 190 07/03/2015   TRIG 85 07/03/2015   HDL 56 07/03/2015   LDLDIRECT 144.4 09/08/2012   LDLCALC 117 07/03/2015   ALT 28 07/03/2015   AST 28 07/03/2015   NA 140 07/03/2015   K 4.4 07/03/2015   CL 103 07/03/2015   CREATININE 0.75 07/03/2015   BUN 22 07/03/2015   CO2 30 07/03/2015   TSH 3.70 07/03/2015   INR 1.00 09/22/2012     Assessment / Plan: 1. Carotid arterial disease s/p left CEA. Stressed the importance of risk factor modification. Continue ASA. Dopplers in September 2017 were good.  2. HTN - continue Tribenzor to 40/5/12.5 mg daily and  Toprol XL 12.5 mg daily. She is tolerating this well.  BP is not ideal but does fluctuate.  3. Hyperlipidemia. Does not want to take statin. On red yeast rice. Lipids are fair  4. Paroxysmal AFib no symptomatic recurrence.  5. Hypothyroidism on synthroid.   6. Hypercalcemia. PTH consistent with mild hyperparathyroidism. She is asymptomatic. No history of renal stones. Will follow up lab work in 6 months.  Follow up in 6 months.

## 2016-02-01 NOTE — Patient Instructions (Signed)
Continue your current therapy  I will see you in 6 months.   

## 2016-02-21 NOTE — Addendum Note (Signed)
Addended by: Burton ApleyPETTY, Jazzy Parmer A on: 02/21/2016 02:14 PM   Modules accepted: Orders

## 2016-06-20 ENCOUNTER — Other Ambulatory Visit: Payer: Self-pay | Admitting: Cardiology

## 2016-07-23 ENCOUNTER — Other Ambulatory Visit: Payer: Self-pay | Admitting: Cardiology

## 2016-07-23 NOTE — Telephone Encounter (Signed)
REFILL 

## 2016-07-29 ENCOUNTER — Encounter: Payer: Self-pay | Admitting: Cardiology

## 2016-08-02 DIAGNOSIS — I779 Disorder of arteries and arterioles, unspecified: Secondary | ICD-10-CM | POA: Diagnosis not present

## 2016-08-02 DIAGNOSIS — E213 Hyperparathyroidism, unspecified: Secondary | ICD-10-CM | POA: Diagnosis not present

## 2016-08-02 DIAGNOSIS — E7800 Pure hypercholesterolemia, unspecified: Secondary | ICD-10-CM | POA: Diagnosis not present

## 2016-08-02 DIAGNOSIS — I10 Essential (primary) hypertension: Secondary | ICD-10-CM | POA: Diagnosis not present

## 2016-08-02 LAB — BASIC METABOLIC PANEL
BUN: 22 mg/dL (ref 7–25)
CALCIUM: 10.6 mg/dL — AB (ref 8.6–10.4)
CHLORIDE: 104 mmol/L (ref 98–110)
CO2: 29 mmol/L (ref 20–31)
CREATININE: 0.77 mg/dL (ref 0.60–0.93)
Glucose, Bld: 102 mg/dL — ABNORMAL HIGH (ref 65–99)
Potassium: 4.1 mmol/L (ref 3.5–5.3)
Sodium: 141 mmol/L (ref 135–146)

## 2016-08-02 LAB — HEPATIC FUNCTION PANEL
ALK PHOS: 73 U/L (ref 33–130)
ALT: 31 U/L — ABNORMAL HIGH (ref 6–29)
AST: 30 U/L (ref 10–35)
Albumin: 4.6 g/dL (ref 3.6–5.1)
BILIRUBIN INDIRECT: 0.5 mg/dL (ref 0.2–1.2)
BILIRUBIN TOTAL: 0.7 mg/dL (ref 0.2–1.2)
Bilirubin, Direct: 0.2 mg/dL (ref <=0.2)
Total Protein: 6.8 g/dL (ref 6.1–8.1)

## 2016-08-02 LAB — LIPID PANEL
Cholesterol: 168 mg/dL (ref <200)
HDL: 57 mg/dL (ref >50)
LDL CALC: 91 mg/dL (ref <100)
Total CHOL/HDL Ratio: 2.9 Ratio (ref <5.0)
Triglycerides: 99 mg/dL (ref <150)
VLDL: 20 mg/dL (ref <30)

## 2016-08-02 LAB — TSH: TSH: 0.34 mIU/L — ABNORMAL LOW

## 2016-08-05 ENCOUNTER — Telehealth: Payer: Self-pay | Admitting: *Deleted

## 2016-08-05 NOTE — Telephone Encounter (Signed)
Spoke with pt, she reports she is taking 1/2 of the 100 mg levothyroxine daily. She is also taking a thyroid support [facility]. Aware will forward this information to dr Swazilandjordan to make him aware.

## 2016-08-05 NOTE — Telephone Encounter (Signed)
-----   Message from Peter M SwazilandJordan, MD sent at 08/03/2016  4:34 PM EDT ----- Chemistries look very good. Lipids are much better. Calcium is chronically high due to hyperparathyroidism. TSH is a little low suggesting her thyroid dose is a little high. Would reduce dose slightly to 100 micrograms daily except take 50 micrograms on Sunday only. Repeat TSH in 3 months.  Peter SwazilandJordan MD, [facility]

## 2016-08-06 NOTE — Telephone Encounter (Signed)
Then she should reduce thyroid dose to 50 micrograms daily except 25 micrograms on one day a week. The effect of her OTC thyroid support really cannot be measured.  Peter SwazilandJordan MD, [facility]

## 2016-08-08 ENCOUNTER — Other Ambulatory Visit: Payer: Self-pay

## 2016-08-08 DIAGNOSIS — E039 Hypothyroidism, unspecified: Secondary | ICD-10-CM

## 2016-08-09 NOTE — Telephone Encounter (Signed)
Spoke to patient yesterday 5/17 Dr.Jordan's recommendations given.She stated she always makes her own thyroid medication adjustments.Stated she will have tsh repeated in 3 months.Lab order mailed.

## 2016-08-12 NOTE — Progress Notes (Signed)
Ashley OchsJudy L Berg Date of Birth: 1945/03/26   History of Present Illness: Ashley Berg is seen back today for follow up HTN. She has a history of HTN and hyperlipidemia. She is s/p left CEA. She has a history of hypothyroidism.  She remains on Tribenzor and metoprolol for BP.   She does not take her blood pressure at home because it makes her feel too anxious.   She had follow up carotid dopplers in September that showed no significant stenosis.   On her last visit she was noted to have a chronically elevated calcium level. PTH level was mildly elevated. No history of renal stones. No fractures. Normal renal function. She was not interested in pursuing surgery.  On follow up today she is now caring for her mother at home. She fractured her hip and was in Rehab. She has Alzheimers and is more confused. Ashley Berg is managing OK. Has some Hospice help. Ashley Berg has lost 11 lbs. BP is improved.  Current Outpatient Prescriptions on File Prior to Visit  Medication Sig Dispense Refill  . aspirin EC 81 MG tablet Take 1 tablet (81 mg total) by mouth daily. 30 tablet 11  . b complex vitamins tablet Take 1 tablet by mouth daily.     . Cholecalciferol ([facility]S VIT D 5000 HIGH-POTENCY) 5000 units capsule Take 1 capsule (5,000 Units total) by mouth daily. 30 capsule 0  . digoxin (LANOXIN) 0.25 MG tablet TAKE 1 TABLET (250 MCG TOTAL) BY MOUTH DAILY. 90 tablet 1  . Homeopathic Products (SIMILASAN DRY EYE RELIEF) SOLN Place 1 drop into both eyes as needed (dry eyes).    . Magnesium 400 MG CAPS Take 1 capsule by mouth 2 (two) times daily.    . metoprolol succinate (TOPROL XL) 25 MG 24 hr tablet Take 0.5 tablets (12.5 mg total) by mouth daily. 45 tablet 3  . NON FORMULARY Primal Lab Cardio Relax AO: Take 1 tab by mouth twice a day    . NON FORMULARY Primal MyoCor Balance: Take 1 tab by mouth twice a day    . Olmesartan-Amlodipine-HCTZ 40-5-12.5 MG TABS TAKE 1 TABLET BY MOUTH DAILY. 90 tablet 3  . OVER THE COUNTER MEDICATION Take  1 tablet by mouth daily. Natural thyroid support    . OVER THE COUNTER MEDICATION Take 1 capsule by mouth daily. polycosanol natural supplement    . POTASSIUM PO Take by mouth.    . Red Yeast Rice Extract (RED YEAST RICE PO) Take 1 capsule by mouth daily.    . [DISCONTINUED] hydrochlorothiazide (MICROZIDE) 12.5 MG capsule Take 1 capsule (12.5 mg total) by mouth daily. 30 capsule 5   No current facility-administered medications on file prior to visit.     Allergies  Allergen Reactions  . Broccoli [Brassica Oleracea Italica]     GI upset  . Chicken Protein     Does not eat meat  . Milk-Related Compounds     GI upset  . Mushroom Extract Complex     GI upset  . Onion     GI upset  . Penicillins Other (See Comments)    "skin crawls"    Past Medical History:  Diagnosis Date  . Carotid artery occlusion   . Hyperlipidemia 08/17/2013  . Hypertension   . Hypothyroidism    Refuses conventional medicine  . PAF (paroxysmal atrial fibrillation) (HCC)   . Seasonal allergies     Past Surgical History:  Procedure Laterality Date  . APPENDECTOMY    . CAROTID ENDARTERECTOMY    .  CERVICAL POLYPECTOMY    . DOPPLER ECHOCARDIOGRAPHY  03/02/1999   NORMAL LEFT VENTRICULAR SIZE. EF 55%  . ENDARTERECTOMY Left 09/24/2012   Procedure: ENDARTERECTOMY CAROTID;  Surgeon: Pryor Ochoa, MD;  Location: [facility] OR;  Service: Vascular;  Laterality: Left;  . EXTERNAL EAR SURGERY Right 1983   tympanoplasty with patch  . PATCH ANGIOPLASTY Left 09/24/2012   Procedure: PATCH ANGIOPLASTY;  Surgeon: Pryor Ochoa, MD;  Location: [facility] OR;  Service: Vascular;  Laterality: Left;  . TUBAL LIGATION      History  Smoking Status  . Never Smoker  Smokeless Tobacco  . Never Used    History  Alcohol Use No    Family History  Problem Relation Age of Onset  . Stroke Father   . Alzheimer's disease Mother     Review of Systems: As noted in HPI.   All other systems were reviewed and are negative.  Physical  Exam: BP (!) 142/62   Pulse 67   Ht 5' 10.5" (1.791 m)   Wt 139 lb 3.2 oz (63.1 kg)   BMI 19.69 kg/m  Patient is pleasant, thin, in no acute distress.  Skin is warm and dry. Color is normal.  HEENT is unremarkable except she appears to have exophthalmus. Normocephalic/atraumatic. PERRL. Sclera are nonicteric. Neck is supple. No masses. No JVD. Lungs are clear. Cardiac exam shows a regular rate and rhythm. Abdomen is soft. Extremities are without edema. Gait and ROM are intact. No gross neurologic deficits noted.  LABORATORY DATA:    Lab Results  Component Value Date   WBC 4.4 07/03/2015   HGB 14.1 07/03/2015   HCT 41.6 07/03/2015   PLT 164 07/03/2015   GLUCOSE 102 (H) 08/02/2016   CHOL 168 08/02/2016   TRIG 99 08/02/2016   HDL 57 08/02/2016   LDLDIRECT 144.4 09/08/2012   LDLCALC 91 08/02/2016   ALT 31 (H) 08/02/2016   AST 30 08/02/2016   NA 141 08/02/2016   K 4.1 08/02/2016   CL 104 08/02/2016   CREATININE 0.77 08/02/2016   BUN 22 08/02/2016   CO2 29 08/02/2016   TSH 0.34 (L) 08/02/2016   INR 1.00 09/22/2012   Ecg today shows NSR with pulmonary disease pattern, incomplete RBBB. LAD. I have personally reviewed and interpreted this study.   Assessment / Plan: 1. Carotid arterial disease s/p left CEA. Stressed the importance of risk factor modification. Continue ASA. Dopplers in September 2017 were good.  2. HTN - continue Tribenzor to 40/5/12.5 mg daily and  Toprol XL 12.5 mg daily. She is tolerating this well.  BP is improved today.  3. Hyperlipidemia. Does not want to take statin. On red yeast rice. Lipids are fair  4. Paroxysmal AFib no symptomatic recurrence.  5. Hypothyroidism on synthroid. Also taking a herbal "thyroid booster". Last TSH was low. Thyroid dose reduced. Will repeat TSH and free T4 in 3 months.  6. Hypercalcemia. PTH consistent with mild hyperparathyroidism. She is asymptomatic. No history of renal stones. Will follow up lab work in 6  months.  Follow up in 6 months.

## 2016-08-13 ENCOUNTER — Encounter: Payer: Self-pay | Admitting: Cardiology

## 2016-08-13 ENCOUNTER — Ambulatory Visit (INDEPENDENT_AMBULATORY_CARE_PROVIDER_SITE_OTHER): Payer: MEDICARE | Admitting: Cardiology

## 2016-08-13 VITALS — BP 142/62 | HR 67 | Ht 70.5 in | Wt 139.2 lb

## 2016-08-13 DIAGNOSIS — E039 Hypothyroidism, unspecified: Secondary | ICD-10-CM

## 2016-08-13 DIAGNOSIS — E213 Hyperparathyroidism, unspecified: Secondary | ICD-10-CM | POA: Diagnosis not present

## 2016-08-13 DIAGNOSIS — E7800 Pure hypercholesterolemia, unspecified: Secondary | ICD-10-CM | POA: Diagnosis not present

## 2016-08-13 DIAGNOSIS — I10 Essential (primary) hypertension: Secondary | ICD-10-CM

## 2016-08-13 NOTE — Patient Instructions (Signed)
Continue your current therapy   We will repeat your thyroid level in 3 months

## 2016-08-13 NOTE — Addendum Note (Signed)
Addended by: Neoma LamingPUGH, Suliman Termini J on: 08/13/2016 05:16 PM   Modules accepted: Orders

## 2016-10-22 ENCOUNTER — Other Ambulatory Visit: Payer: Self-pay

## 2016-10-22 MED ORDER — DIGOXIN 250 MCG PO TABS: 250.0000 ug | ORAL_TABLET | Freq: Every day | ORAL | 3 refills | Status: DC

## 2016-11-04 DIAGNOSIS — E039 Hypothyroidism, unspecified: Secondary | ICD-10-CM | POA: Diagnosis not present

## 2016-11-04 DIAGNOSIS — E7800 Pure hypercholesterolemia, unspecified: Secondary | ICD-10-CM | POA: Diagnosis not present

## 2016-11-04 DIAGNOSIS — I10 Essential (primary) hypertension: Secondary | ICD-10-CM | POA: Diagnosis not present

## 2016-11-04 DIAGNOSIS — E213 Hyperparathyroidism, unspecified: Secondary | ICD-10-CM | POA: Diagnosis not present

## 2016-11-04 LAB — TSH: TSH: 5.56 u[IU]/mL — ABNORMAL HIGH (ref 0.450–4.500)

## 2016-11-04 LAB — T4, FREE: Free T4: 1.31 ng/dL (ref 0.82–1.77)

## 2016-11-15 ENCOUNTER — Other Ambulatory Visit: Payer: Self-pay | Admitting: Cardiology

## 2016-11-22 ENCOUNTER — Telehealth: Payer: Self-pay | Admitting: Family Medicine

## 2016-11-22 NOTE — Telephone Encounter (Signed)
PATIENT WANTS THIS MESSAGE TO GO TO DR. SHAW. SHE SAID DR. SHAW USE TO TAKE CARE OF HER MOTHER WHO IS IN A NURSING HOME NOW. SHE WANTS TO TALK ABOUT HAVING NUMBNESS IN HER (L) ARM. SHE THINKS IT MAY BE A PINCHED NERVE. (I EXPLAINED OUR CALL BACK POLICY AND ASKED HER IF SHE WANTED TO MAKE AN APPOINTMENT TO SEE DR. SHAW TOMORROW 11/23/16). SHE DID NOT AND JUST WANTS DR. SHAW TO CALL HER. BEST PHONE [phone] (HOME) OR [phone] (CELL) MBC

## 2016-11-23 NOTE — Telephone Encounter (Signed)
Pt called back to let us know that the issues has been resolved and does not need a call back from Dr. Clelia CroftShaw

## 2016-11-26 NOTE — Telephone Encounter (Signed)
Ok.  I think this was put in the wrong chart - I assume this is the chart of the caller (pt's daughter) as this pt has never been seen at PCP or by me.  I do not know who her mother is - no name or DOB given.

## 2016-11-26 NOTE — Telephone Encounter (Signed)
FYI

## 2016-12-27 ENCOUNTER — Encounter: Payer: Self-pay | Admitting: Family

## 2016-12-27 ENCOUNTER — Ambulatory Visit (HOSPITAL_COMMUNITY)
Admission: RE | Admit: 2016-12-27 | Discharge: 2016-12-27 | Disposition: A | Discharge status: Home / Self Care | Payer: MEDICARE | Source: Ambulatory Visit | Attending: Family | Admitting: Family

## 2016-12-27 ENCOUNTER — Ambulatory Visit (INDEPENDENT_AMBULATORY_CARE_PROVIDER_SITE_OTHER): Payer: MEDICARE | Admitting: Family

## 2016-12-27 VITALS — BP 161/70 | HR 60 | Temp 98.4°F | Resp 16 | Ht 70.5 in | Wt 147.0 lb

## 2016-12-27 DIAGNOSIS — I6522 Occlusion and stenosis of left carotid artery: Secondary | ICD-10-CM

## 2016-12-27 DIAGNOSIS — Z9889 Other specified postprocedural states: Secondary | ICD-10-CM | POA: Insufficient documentation

## 2016-12-27 DIAGNOSIS — I6523 Occlusion and stenosis of bilateral carotid arteries: Secondary | ICD-10-CM | POA: Insufficient documentation

## 2016-12-27 LAB — VAS US CAROTID
LCCAPDIAS: 20 cm/s
LCCAPSYS: 139 cm/s
LEFT ECA DIAS: -8 cm/s
LEFT VERTEBRAL DIAS: -10 cm/s
LICADDIAS: -26 cm/s
LICAPSYS: -135 cm/s
Left CCA dist dias: -19 cm/s
Left CCA dist sys: -179 cm/s
Left ICA dist sys: -99 cm/s
Left ICA prox dias: -17 cm/s
RCCADSYS: -88 cm/s
RCCAPSYS: 123 cm/s
RIGHT CCA MID DIAS: -22 cm/s
RIGHT ECA DIAS: -12 cm/s
RIGHT VERTEBRAL DIAS: -10 cm/s
Right CCA prox dias: 19 cm/s

## 2016-12-27 NOTE — Progress Notes (Signed)
Chief Complaint: Follow up Extracranial Carotid Artery Stenosis   History of Present Illness  Ashley Berg is a 71 y.o. female patient of Dr. Hart Rochester who returns for continued followup regarding her left carotid endarterectomy July , 2014. Patient was asymptomatic preoperatively and remains asymptomatic. She has no complaints including lateralizing weakness, aphasia, procedures, diplopia, blurred vision, or syncope. She also denies any significant claudication symptoms in her lower extremities. She has no history of coronary artery disease. She does have a history of atrial fibrillation and was treated with digoxin and is followed by Dr. Peter [place]. She takes daily aspirin.  Pt states her blood pressure increases in a doctor office but does not check her blood pressure at home. She states Dr. [place] has been monitoring her PTH and calcium levels. Pt states she has been taking 5000 IU of vitamin D for 2 years. She probably needs her vitamin D levels checked, which affects the PTH and calcium levels.  Pt does not have a PCP, but states she will make an appoitment with dr. Norberto Sorenson to establish PCP care, since Dr. Clelia Croft is pt's mother's PCP.  Pt's mother is in a nursing facility, in a Hospice program.   Pt Diabetic: no Pt smoker: non-smoker, but she was exposed to her husband's second hand smoke for 32 years. He died in 2007/08/21.  Pt meds include: Statin : no, on Red Yeast Rice and polycosanol ASA: yes Other anticoagulants/antiplatelets: no   Past Medical History:  Diagnosis Date  . Carotid artery occlusion   . Hyperlipidemia 08/17/2013  . Hypertension   . Hypothyroidism    Refuses conventional medicine  . PAF (paroxysmal atrial fibrillation) (HCC)   . Seasonal allergies     Social History Social History  Substance Use Topics  . Smoking status: Never Smoker  . Smokeless tobacco: Never Used  . Alcohol use No    Family History Family History  Problem Relation Age of Onset  .  Stroke Father   . Alzheimer's disease Mother     Surgical History Past Surgical History:  Procedure Laterality Date  . APPENDECTOMY    . CAROTID ENDARTERECTOMY    . CERVICAL POLYPECTOMY    . DOPPLER ECHOCARDIOGRAPHY  03/02/1999   NORMAL LEFT VENTRICULAR SIZE. EF 55%  . ENDARTERECTOMY Left 09/24/2012   Procedure: ENDARTERECTOMY CAROTID;  Surgeon: Pryor Ochoa, MD;  Location: [facility] OR;  Service: Vascular;  Laterality: Left;  . EXTERNAL EAR SURGERY Right 1983   tympanoplasty with patch  . PATCH ANGIOPLASTY Left 09/24/2012   Procedure: PATCH ANGIOPLASTY;  Surgeon: Pryor Ochoa, MD;  Location: [facility] OR;  Service: Vascular;  Laterality: Left;  . TUBAL LIGATION      Allergies  Allergen Reactions  . Broccoli [Brassica Oleracea Italica]     GI upset  . Chicken Protein     Does not eat meat  . Milk-Related Compounds     GI upset  . Mushroom Extract Complex     GI upset  . Onion     GI upset  . Penicillins Other (See Comments)    "skin crawls"    Current Outpatient Prescriptions  Medication Sig Dispense Refill  . aspirin EC 81 MG tablet Take 1 tablet (81 mg total) by mouth daily. 30 tablet 11  . b complex vitamins tablet Take 1 tablet by mouth daily.     . Cholecalciferol (CVS VIT D 5000 HIGH-POTENCY) 5000 units capsule Take 1 capsule (5,000 Units total) by mouth daily. 30 capsule  0  . digoxin (LANOXIN) 0.25 MG tablet Take 1 tablet (250 mcg total) by mouth daily. 90 tablet 3  . Glucosamine-Chondroitin-MSM (TRIPLE FLEX PO) Take 1 tablet by mouth as directed.    . Homeopathic Products (SIMILASAN DRY EYE RELIEF) SOLN Place 1 drop into both eyes as needed (dry eyes).    [name] levothyroxine (SYNTHROID, LEVOTHROID) 100 MCG tablet TAKE 1 TABLET BY MOUTH DAILY BEFORE BREAKFAST. (Patient taking differently: TAKE 1/4 TABLET BY MOUTH DAILY BEFORE BREAKFAST.) 90 tablet 3  . Magnesium 400 MG CAPS Take 1 capsule by mouth 2 (two) times daily.    . metoprolol succinate (TOPROL XL) 25 MG 24 hr tablet Take  0.5 tablets (12.5 mg total) by mouth daily. 45 tablet 3  . NON FORMULARY Primal Lab Cardio Relax AO: Take 1 tab by mouth twice a day    . NON FORMULARY Primal MyoCor Balance: Take 1 tab by mouth twice a day    . Olmesartan-Amlodipine-HCTZ 40-5-12.5 MG TABS TAKE 1 TABLET BY MOUTH DAILY. 90 tablet 3  . OVER THE COUNTER MEDICATION Take 1 tablet by mouth daily. Natural thyroid support    . OVER THE COUNTER MEDICATION Take 1 capsule by mouth daily. polycosanol natural supplement    . POTASSIUM PO Take by mouth.    . Red Yeast Rice Extract (RED YEAST RICE PO) Take 1 capsule by mouth daily.     No current facility-administered medications for this visit.     Review of Systems : See HPI for pertinent positives and negatives.  Physical Examination  Vitals:   12/27/16 1606 12/27/16 1610  BP: (!) 167/80 (!) 161/70  Pulse: 60   Resp: 16   Temp: 98.4 F (36.9 C)   TempSrc: Oral   SpO2: 96%   Weight: 147 lb (66.7 kg)   Height: 5' 10.5" (1.791 m)    Body mass index is 20.79 kg/m.  General: WDWN female in NAD GAIT:normal Eyes: PERRLA Pulmonary: Respirations are non-labored, CTAB, no rales, no rhonchi, &no wheezing.  Cardiac: regular rhythm and rate, no detected murmur.  VASCULAR EXAM Carotid Bruits Right Left   Negative Negative   Abdominal aortic pulse is not palpable. Radial pulses are 2+ palpable and equal.   LE Pulses Right Left  POPLITEAL not palpable not palpable  POSTERIOR TIBIAL 2+ palpable 2+ palpable  DORSALIS PEDIS ANTERIOR TIBIAL 2+ palpable 2+ palpable    Gastrointestinal: soft, nontender, BS WNL, no r/g, no palpable masses.  Musculoskeletal: No muscle atrophy/wasting. M/S 5/5 throughout, extremities without ischemic changes.  Neurologic: A&O X 3; appropriate affect, speech is normal, CN 2-12 intact, pain and light touch intact in extremities, Motor exam as listed above.    Assessment: Ashley Berg is a 71 y.o. female who is s/p left carotid endarterectomy on 09/24/2012. She has no history of stroke or TIA.  DATA Carotid Duplex (12/27/16):  Right ICA : 40-59% stenosis. Left ICA: carotid endarterectomy site with no evidence of hyperplasia or restenosis. Bilateral vertebral artery flow is antegrade.  Bilateral subclavian artery waveforms are normal.  Progression of right ICA stenosis compared to the last exam on 12-19-15.    Plan: Follow-up in 1 year with Carotid Duplex scan.    I discussed in depth with the patient the nature of atherosclerosis, and emphasized the importance of maximal medical management including strict control of blood pressure, blood glucose, and lipid levels, obtaining regular exercise, and continued cessation of smoking.  The patient is aware that without maximal medical management the  underlying atherosclerotic disease process will progress, limiting the benefit of any interventions. The patient was given information about stroke prevention and what symptoms should prompt the patient to seek immediate medical care. Thank you for allowing [place] to participate in this patient's care.  Charisse March, RN, MSN, FNP-C [facility] Office: [phone]  Clinic Physician: Cain/Chen  12/27/16 4:22 PM

## 2016-12-27 NOTE — Patient Instructions (Signed)
Stroke Prevention Some medical conditions and behaviors are associated with an increased chance of having a stroke. You may prevent a stroke by making healthy choices and managing medical conditions. How can I reduce my risk of having a stroke?  Stay physically active. Get at least 30 minutes of activity on most or all days.  Do not smoke. It may also be helpful to avoid exposure to secondhand smoke.  Limit alcohol use. Moderate alcohol use is considered to be: ? No more than 2 drinks per day for men. ? No more than 1 drink per day for nonpregnant women.  Eat healthy foods. This involves: ? Eating 5 or more servings of fruits and vegetables a day. ? Making dietary changes that address high blood pressure (hypertension), high cholesterol, diabetes, or obesity.  Manage your cholesterol levels. ? Making food choices that are high in fiber and low in saturated fat, trans fat, and cholesterol may control cholesterol levels. ? Take any prescribed medicines to control cholesterol as directed by your health care provider.  Manage your diabetes. ? Controlling your carbohydrate and sugar intake is recommended to manage diabetes. ? Take any prescribed medicines to control diabetes as directed by your health care provider.  Control your hypertension. ? Making food choices that are low in salt (sodium), saturated fat, trans fat, and cholesterol is recommended to manage hypertension. ? Ask your health care provider if you need treatment to lower your blood pressure. Take any prescribed medicines to control hypertension as directed by your health care provider. ? If you are 18-39 years of age, have your blood pressure checked every 3-5 years. If you are 40 years of age or older, have your blood pressure checked every year.  Maintain a healthy weight. ? Reducing calorie intake and making food choices that are low in sodium, saturated fat, trans fat, and cholesterol are recommended to manage  weight.  Stop drug abuse.  Avoid taking birth control pills. ? Talk to your health care provider about the risks of taking birth control pills if you are over 35 years old, smoke, get migraines, or have ever had a blood clot.  Get evaluated for sleep disorders (sleep apnea). ? Talk to your health care provider about getting a sleep evaluation if you snore a lot or have excessive sleepiness.  Take medicines only as directed by your health care provider. ? For some people, aspirin or blood thinners (anticoagulants) are helpful in reducing the risk of forming abnormal blood clots that can lead to stroke. If you have the irregular heart rhythm of atrial fibrillation, you should be on a blood thinner unless there is a good reason you cannot take them. ? Understand all your medicine instructions.  Make sure that other conditions (such as anemia or atherosclerosis) are addressed. Get help right away if:  You have sudden weakness or numbness of the face, arm, or leg, especially on one side of the body.  Your face or eyelid droops to one side.  You have sudden confusion.  You have trouble speaking (aphasia) or understanding.  You have sudden trouble seeing in one or both eyes.  You have sudden trouble walking.  You have dizziness.  You have a loss of balance or coordination.  You have a sudden, severe headache with no known cause.  You have new chest pain or an irregular heartbeat. Any of these symptoms may represent a serious problem that is an emergency. Do not wait to see if the symptoms will go away.   Get medical help at once. Call your local emergency services (911 in [place] Do not drive yourself to the hospital. This information is not intended to replace advice given to you by your health care provider. Make sure you discuss any questions you have with your health care provider. Document Released: 04/18/2004 Document Revised: 08/17/2015 Document Reviewed: 09/11/2012 Elsevier  Interactive Patient Education  2017 Elsevier Inc.     Preventing Cerebrovascular Disease Arteries are blood vessels that carry blood that contains oxygen from the heart to all parts of the body. Cerebrovascular disease affects arteries that supply the brain. Any condition that blocks or disrupts blood flow to the brain can cause cerebrovascular disease. Brain cells that lose blood supply start to die within minutes (stroke). Stroke is the main danger of cerebrovascular disease. Atherosclerosis and high blood pressure are common causes of cerebrovascular disease. Atherosclerosis is narrowing and hardening of an artery that results when fat, cholesterol, calcium, or other substances (plaque) build up inside an artery. Plaque reduces blood flow through the artery. High blood pressure increases the risk of bleeding inside the brain. Making diet and lifestyle changes to prevent atherosclerosis and high blood pressure lowers your risk of cerebrovascular disease. What nutrition changes can be made?  Eat more fruits, vegetables, and whole grains.  Reduce how much saturated fat you eat. To do this, eat less red meat and fewer full-fat dairy products.  Eat healthy proteins instead of red meat. Healthy proteins include: ? Fish. Eat fish that contains heart-healthy omega-3 fatty acids, twice a week. Examples include salmon, albacore tuna, mackerel, and herring. ? Chicken. ? Nuts. ? Low-fat or nonfat yogurt.  Avoid processed meats, like bacon and lunchmeat.  Avoid foods that contain: ? A lot of sugar, such as sweets and drinks with added sugar. ? A lot of salt (sodium). Avoid adding extra salt to your food, as told by your health care provider. ? Trans fats, such as margarine and baked goods. Trans fats may be listed as "partially hydrogenated oils" on food labels.  Check food labels to see how much sodium, sugar, and trans fats are in foods.  Use vegetable oils that contain low amounts of  saturated fat, such as olive oil or canola oil. What lifestyle changes can be made?  Drink alcohol in moderation. This means no more than 1 drink a day for nonpregnant women and 2 drinks a day for men. One drink equals 12 oz of beer, 5 oz of wine, or 1 oz of hard liquor.  If you are overweight, ask your health care provider to recommend a weight-loss plan for you. Losing 5-10 lb (2.2-4.5 kg) can reduce your risk of diabetes, atherosclerosis, and high blood pressure.  Exercise for 30?60 minutes on most days, or as much as told by your health care provider. ? Do moderate-intensity exercise, such as brisk walking, bicycling, and water aerobics. Ask your health care provider which activities are safe for you.  Do not use any products that contain nicotine or tobacco, such as cigarettes and e-cigarettes. If you need help quitting, ask your health care provider. Why are these changes important? Making these changes lowers your risk of many diseases that can cause cerebrovascular disease and stroke. Stroke is a leading cause of death and disability. Making these changes also improves your overall health and quality of life. What can I do to lower my risk? The following factors make you more likely to develop cerebrovascular disease:  Being overweight.  Smoking.  Being physically inactive.    Eating a high-fat diet.  Having certain health conditions, such as: ? Diabetes. ? High blood pressure. ? Heart disease. ? Atherosclerosis. ? High cholesterol. ? Sickle cell disease.  Talk with your health care provider about your risk for cerebrovascular disease. Work with your health care provider to control diseases that you have that may contribute to cerebrovascular disease. Your health care provider may prescribe medicines to help prevent major causes of cerebrovascular disease. Where to find more information: Learn more about preventing cerebrovascular disease from:  National Heart, Lung, and  Blood Institute: www.nhlbi.nih.gov/health/health-topics/topics/stroke  Centers for Disease Control and Prevention: cdc.gov/stroke/about.htm  Summary  Cerebrovascular disease can lead to a stroke.  Atherosclerosis and high blood pressure are major causes of cerebrovascular disease.  Making diet and lifestyle changes can reduce your risk of cerebrovascular disease.  Work with your health care provider to get your risk factors under control to reduce your risk of cerebrovascular disease. This information is not intended to replace advice given to you by your health care provider. Make sure you discuss any questions you have with your health care provider. Document Released: 03/26/2015 Document Revised: 09/29/2015 Document Reviewed: 03/26/2015 Elsevier Interactive Patient Education  2018 Elsevier Inc.  

## 2017-01-13 ENCOUNTER — Other Ambulatory Visit: Payer: Self-pay | Admitting: Cardiology

## 2017-01-13 NOTE — Telephone Encounter (Signed)
REFILL 

## 2017-01-17 ENCOUNTER — Other Ambulatory Visit: Payer: Self-pay | Admitting: Cardiology

## 2017-02-04 NOTE — Progress Notes (Signed)
Ashley OchsJudy L Berg Date of Birth: 08/18/1945   History of Present Illness: Ashley HongJudy is seen back today for follow up HTN. She has a history of HTN and hyperlipidemia. She is s/p left CEA. She has a history of hypothyroidism.  She remains on Tribenzor and metoprolol for BP.   She does not take her blood pressure at home because it makes her feel too anxious.    On her last visit she was noted to have a chronically elevated calcium level. PTH level was mildly elevated. No history of renal stones. No fractures. Normal renal function. She was not interested in pursuing surgery. She did reduce her supplement of Vitamin D.   On follow up today she is doing well. Her mother is now in a nursing facility and this has reduced her stress. She has gained her normal weight back. She denies any palpitations, SOB, chest pain. Energy level is good.   Current Outpatient Medications on File Prior to Visit  Medication Sig Dispense Refill  . aspirin EC 81 MG tablet Take 1 tablet (81 mg total) by mouth daily. 30 tablet 11  . b complex vitamins tablet Take 1 tablet by mouth daily.     . Cholecalciferol ([employer] VIT D 5000 HIGH-POTENCY) 5000 units capsule Take 1 capsule (5,000 Units total) by mouth daily. 30 capsule 0  . Glucosamine-Chondroitin-MSM (TRIPLE FLEX PO) Take 1 tablet by mouth as directed.    . Homeopathic Products (SIMILASAN DRY EYE RELIEF) SOLN Place 1 drop into both eyes as needed (dry eyes).    [name]. levothyroxine (SYNTHROID, LEVOTHROID) 100 MCG tablet TAKE 1 TABLET BY MOUTH DAILY BEFORE BREAKFAST. (Patient taking differently: TAKE 1/4 TABLET BY MOUTH DAILY BEFORE BREAKFAST.) 90 tablet 3  . Magnesium 400 MG CAPS Take 1 capsule by mouth 2 (two) times daily.    . metoprolol succinate (TOPROL-XL) 25 MG 24 hr tablet TAKE 1/2 (ONE HALF) TABLETS (12.5 MG TOTAL) BY MOUTH DAILY. 45 tablet 3  . NON FORMULARY Primal Lab Cardio Relax AO: Take 1 tab by mouth twice a day    . NON FORMULARY Primal MyoCor Balance: Take 1 tab  by mouth twice a day    . Olmesartan-Amlodipine-HCTZ 40-5-12.5 MG TABS TAKE 1 TABLET BY MOUTH DAILY. 90 tablet 3  . OVER THE COUNTER MEDICATION Take 1 tablet by mouth daily. Natural thyroid support    . OVER THE COUNTER MEDICATION Take 1 capsule by mouth daily. polycosanol natural supplement    . POTASSIUM PO Take by mouth.    . Red Yeast Rice Extract (RED YEAST RICE PO) Take 1 capsule by mouth daily.    . [DISCONTINUED] hydrochlorothiazide (MICROZIDE) 12.5 MG capsule Take 1 capsule (12.5 mg total) by mouth daily. 30 capsule 5   No current facility-administered medications on file prior to visit.     Allergies  Allergen Reactions  . Broccoli [Brassica Oleracea Italica]     GI upset  . Chicken Protein     Does not eat meat  . Milk-Related Compounds     GI upset  . Mushroom Extract Complex     GI upset  . Onion     GI upset  . Penicillins Other (See Comments)    "skin crawls"    Past Medical History:  Diagnosis Date  . Carotid artery occlusion   . Hyperlipidemia 08/17/2013  . Hypertension   . Hypothyroidism    Refuses conventional medicine  . PAF (paroxysmal atrial fibrillation) (HCC)   . Seasonal allergies  Past Surgical History:  Procedure Laterality Date  . APPENDECTOMY    . CAROTID ENDARTERECTOMY    . CERVICAL POLYPECTOMY    . DOPPLER ECHOCARDIOGRAPHY  03/02/1999   NORMAL LEFT VENTRICULAR SIZE. EF 55%  . ENDARTERECTOMY CAROTID Left 09/24/2012   Performed by [facility]ry[facility] OchoaLawson[facility] James D[facility]  . EXTERNAL EAR SURGERY Right 1983   tympanoplasty with patch  . PATCH ANGIOPLASTY Left 09/24/2012   [facility]Lawson[facility]  . TUBAL LIGATION      Social History   Tobacco Use  Smoking Status Never Smoker  Smokeless Tobacco Never Used    Social History   Substance and Sexual Activity  Alcohol Use No  . Alcohol/week: 0.0 oz    Family History  Problem Relation Age of Onset  . Stroke Father   . Alzheimer's disease Mother     Review of Systems: As  noted in HPI.   All other systems were reviewed and are negative.  Physical Exam: BP (!) 164/68 (BP Location: Right Arm, Cuff Size: Normal)   Pulse 61   Ht 5' 10.5" (1.791 m)   Wt 150 lb 9.6 oz (68.3 kg)   SpO2 100%   BMI 21.30 kg/m  GENERAL:  Well appearing HEENT:  PERRL, EOMI, sclera are clear. Oropharynx is clear. NECK:  No jugular venous distention, carotid upstroke brisk and symmetric, no bruits, no thyromegaly or adenopathy LUNGS:  Clear to auscultation bilaterally CHEST:  Unremarkable HEART:  RRR,  PMI not displaced or sustained,S1 and S2 within normal limits, no S3, no S4: no clicks, no rubs, no murmurs ABD:  Soft, nontender. BS +, no masses or bruits. No hepatomegaly, no splenomegaly EXT:  2 + pulses throughout, no edema, no cyanosis no clubbing SKIN:  Warm and dry.  No rashes NEURO:  Alert and oriented x 3. Cranial nerves II through XII intact. PSYCH:  Cognitively intact    LABORATORY DATA:    Lab Results  Component Value Date   WBC 4.4 07/03/2015   HGB 14.1 07/03/2015   HCT 41.6 07/03/2015   PLT 164 07/03/2015   GLUCOSE 102 (H) 08/02/2016   CHOL 168 08/02/2016   TRIG 99 08/02/2016   HDL 57 08/02/2016   LDLDIRECT 144.4 09/08/2012   LDLCALC 91 08/02/2016   ALT 31 (H) 08/02/2016   AST 30 08/02/2016   NA 141 08/02/2016   K 4.1 08/02/2016   CL 104 08/02/2016   CREATININE 0.77 08/02/2016   BUN 22 08/02/2016   CO2 29 08/02/2016   TSH 5.560 (H) 11/04/2016   INR 1.00 09/22/2012    Assessment / Plan: 1. Carotid arterial disease s/p left CEA.  Continue ASA. Dopplers in October 2018 showed 40-59% RICA disease and no restenosis on the left. Refuses to take statin. On red yeast rice.   2. HTN - continue Tribenzor to 40/5/12.5 mg daily and  Toprol XL 12.5 mg daily. She is tolerating this well.  BP is elevated today but generally OK.  3. Hyperlipidemia. Does not want to take statin. On red yeast rice. Lipids are fair  4. Paroxysmal AFib- remote history - no  symptomatic recurrence. Will reduce digoxin to 0.125 mg daily. Check dig level next visit.   5. Hypothyroidism on synthroid. Last TSH ok.   6. Hypercalcemia. PTH consistent with mild hyperparathyroidism. Calcium levels stable. She is asymptomatic.   I will follow up in 6 months with lab work.  Follow up in 6 months.

## 2017-02-07 ENCOUNTER — Ambulatory Visit (INDEPENDENT_AMBULATORY_CARE_PROVIDER_SITE_OTHER): Payer: MEDICARE | Admitting: Cardiology

## 2017-02-07 ENCOUNTER — Encounter: Payer: Self-pay | Admitting: Cardiology

## 2017-02-07 VITALS — BP 164/68 | HR 61 | Ht 70.5 in | Wt 150.6 lb

## 2017-02-07 DIAGNOSIS — I6522 Occlusion and stenosis of left carotid artery: Secondary | ICD-10-CM

## 2017-02-07 DIAGNOSIS — E7800 Pure hypercholesterolemia, unspecified: Secondary | ICD-10-CM

## 2017-02-07 DIAGNOSIS — I10 Essential (primary) hypertension: Secondary | ICD-10-CM | POA: Diagnosis not present

## 2017-02-07 MED ORDER — DIGOXIN 125 MCG PO TABS: 0.1250 mg | ORAL_TABLET | Freq: Every day | ORAL | 3 refills | Status: DC

## 2017-02-07 NOTE — Addendum Note (Signed)
Addended by: Neoma LamingPUGH, Darol Cush J on: 02/07/2017 04:43 PM   Modules accepted: Orders

## 2017-02-07 NOTE — Patient Instructions (Signed)
Reduce digoxin to 0.125 mg daily.   Continue your other therapy  I will see you in 6 months with lab work

## 2017-02-20 NOTE — Addendum Note (Signed)
Addended by: Harmoni Lucus A on: 02/20/2017 10:17 AM   Modules accepted: Orders  

## 2017-05-07 DIAGNOSIS — H1132 Conjunctival hemorrhage, left eye: Secondary | ICD-10-CM | POA: Diagnosis not present

## 2017-05-09 ENCOUNTER — Ambulatory Visit: Payer: Self-pay | Admitting: Family Medicine

## 2017-06-06 ENCOUNTER — Other Ambulatory Visit: Payer: Self-pay | Admitting: Cardiology

## 2017-06-16 ENCOUNTER — Telehealth: Payer: Self-pay | Admitting: Cardiology

## 2017-06-16 NOTE — Telephone Encounter (Signed)
pt called with some tingling Lt lateral chest wall and into lt arm.  She does have neck issues and has adjusted at times.  No other symptoms, no weakness, no tachycardia.  She began to feel better just discussing it.  She is ware to go to ER if symptoms increase.  I will send note to Dr. SwazilandJordan and his nurse to have  Pt seen this week to check EKG and BP.  Pt was happy with this plan.

## 2017-06-17 NOTE — Telephone Encounter (Signed)
Spoke to patient she stated she had a episode last night severe indigestion,tingling sensation in left arm,pain in left neck.She took acid reflux medication, it seemed to help alittle.She feels better this morning,but would like to be seen this week.Appointment offered tomorrow, but she refused.Stated she needs a afternoon appointment.No afternoon appointments available this week.Appointment scheduled with Joni ReiningKathryn [name] DNP 06/24/17 at 2:00 pm.Advised to go to ED if needed.

## 2017-06-18 ENCOUNTER — Ambulatory Visit: Payer: MEDICARE | Admitting: Adult Health

## 2017-06-23 NOTE — Progress Notes (Signed)
Cardiology Office Note   Date:  06/24/2017   ID:  Ashley OchsJudy L Berg, DOB May 25, 1945, MRN [id number]  PCP:  Patient, No Pcp Per  Cardiologist: Dr. SwazilandJordan  Chief Complaint  Patient presents with  . Gastroesophageal Reflux    tingling in arm and neck  . Hypertension     History of Present Illness: Ashley Berg is a 72 y.o. female who presents for ongoing assessment and management of hypertension with history of hyperlipidemia, carotid artery disease status post left CEA, and hypothyroidism.  She was last seen by Dr. SwazilandJordan on 02/07/2017.  At that time, the patient was doing well, she was concerned about her mother who is now in a nursing facility, but this is to reduce her stress level essentially at home.  Of note, Dr. SwazilandJordan documented that she refuses statin therapy and continues on red yeast rice.  The patient is very particular on which medication she is willing to take.  She also takes a lot of neuropathic supplements.  The patient has had one episode on June 16, 2017 which she describes as a tingling feeling in her left arm and shoulder lasting several hours.  She also had worsening GERD symptoms with burping.  The GERD symptoms have been ongoing along with the burping.  She brings with her a narrative of her symptoms which she reads to me.  She states during that episode she took an additional 1/2 tablet of metoprolol, another dose of Tribenzor, and 2 baby aspirin.  States his symptoms were relieved.  She followed up with her chiropractor who manipulated her cervical spine and told her that her C5 was displaced.  Since having this adjustment she has had no further episodes of left arm tingling.  Past Medical History:  Diagnosis Date  . Carotid artery occlusion   . Hyperlipidemia 08/17/2013  . Hypertension   . Hypothyroidism    Refuses conventional medicine  . PAF (paroxysmal atrial fibrillation) (HCC)   . Seasonal allergies     Past Surgical History:  Procedure Laterality Date  .  APPENDECTOMY    . CAROTID ENDARTERECTOMY    . CERVICAL POLYPECTOMY    . DOPPLER ECHOCARDIOGRAPHY  03/02/1999   NORMAL LEFT VENTRICULAR SIZE. EF 55%  . ENDARTERECTOMY Left 09/24/2012   Procedure: ENDARTERECTOMY CAROTID;  Surgeon: Pryor OchoaJames D Lawson, MD;  [facility];  Service: Vascular;  Laterality: Left;  . EXTERNAL EAR SURGERY Right 1983   tympanoplasty with patch  . PATCH ANGIOPLASTY Left 09/24/2012   Procedure: PATCH ANGIOPLASTY;  Surgeon: Pryor OchoaJames D Lawson, MD;  [facility];  Service: Vascular;  Laterality: Left;  . TUBAL LIGATION       Current Outpatient Medications  Medication Sig Dispense Refill  . digoxin (LANOXIN) 0.125 MG tablet Take by mouth daily. Take one half tablet daily    . Vitamin D, Cholecalciferol, 1000 units CAPS Take 1 capsule by mouth daily.    [name]. aspirin EC 81 MG tablet Take 1 tablet (81 mg total) by mouth daily. 30 tablet 11  . b complex vitamins tablet Take 1 tablet by mouth daily.     . Glucosamine-Chondroitin-MSM (TRIPLE FLEX PO) Take 1 tablet by mouth as directed.    . Homeopathic Products (SIMILASAN DRY EYE RELIEF) SOLN Place 1 drop into both eyes as needed (dry eyes).    [name]. levothyroxine (SYNTHROID, LEVOTHROID) 100 MCG tablet TAKE 1 TABLET BY MOUTH DAILY BEFORE BREAKFAST. (Patient taking differently: TAKE 1/4 TABLET BY MOUTH DAILY BEFORE BREAKFAST.) 90 tablet 3  .  Magnesium 400 MG CAPS Take 1 capsule by mouth 2 (two) times daily.    . metoprolol succinate (TOPROL-XL) 25 MG 24 hr tablet TAKE 1/2 (ONE HALF) TABLETS (12.5 MG TOTAL) BY MOUTH DAILY. 45 tablet 3  . NON FORMULARY Primal Lab Cardio Relax AO: Take 1 tab by mouth twice a day    . NON FORMULARY Primal MyoCor Balance: Take 1 tab by mouth twice a day    . Olmesartan-amLODIPine-HCTZ 40-10-25 MG TABS Take 1 tablet by mouth daily. 30 tablet 5  . OVER THE COUNTER MEDICATION Take 1 tablet by mouth daily. Natural thyroid support    . OVER THE COUNTER MEDICATION Take 1 capsule by mouth daily. polycosanol natural  supplement    . POTASSIUM PO Take by mouth.    . Red Yeast Rice Extract (RED YEAST RICE PO) Take 1 capsule by mouth daily.     No current facility-administered medications for this visit.     Allergies:   Broccoli [brassica oleracea italica]; Chicken protein; Milk-related compounds; Mushroom extract complex; Onion; and Penicillins    Social History:  The patient  reports that she has never smoked. She has never used smokeless tobacco. She reports that she does not drink alcohol or use drugs.   Family History:  The patient's family history includes Alzheimer's disease in her mother; Stroke in her father.    ROS: All other systems are reviewed and negative. Unless otherwise mentioned in H&P    PHYSICAL EXAM: VS:  BP (!) 168/78   Pulse 63   Ht 5' 10.5" (1.791 m)   Wt 149 lb 9.6 oz (67.9 kg)   BMI 21.16 kg/m  , BMI Body mass index is 21.16 kg/m. GEN: Well nourished, well developed, in no acute distress  HEENT: normal  Neck: no JVD, carotid bruits, or masses Cardiac: RRR; 1/6 systolic no murmurs, rubs, or gallops,no edema  Respiratory:  Clear to auscultation bilaterally, normal work of breathing GI: soft, nontender, nondistended, + BS MS: no deformity or atrophy  Skin: warm and dry, no rash Neuro:  Strength and sensation are intact Psych: euthymic mood, full affect   EKG: NSR with non-specific T abnormallity.   Recent Labs: 08/02/2016: ALT 31; BUN 22; Creat 0.77; Potassium 4.1; Sodium 141 11/04/2016: TSH 5.560    Lipid Panel    Component Value Date/Time   CHOL 168 08/02/2016 1151   TRIG 99 08/02/2016 1151   HDL 57 08/02/2016 1151   CHOLHDL 2.9 08/02/2016 1151   VLDL 20 08/02/2016 1151   LDLCALC 91 08/02/2016 1151   LDLDIRECT 144.4 09/08/2012 1004      Wt Readings from Last 3 Encounters:  06/24/17 149 lb 9.6 oz (67.9 kg)  02/07/17 150 lb 9.6 oz (68.3 kg)  12/27/16 147 lb (66.7 kg)    Other studies Reviewed:    ASSESSMENT AND PLAN:  1.  Hypertension: The  patient's blood pressure has been labile, she is also been taking extra doses when she feels the tingling in her left shoulder.  Blood pressure today is elevated and I have also rechecked it manually.  He states that Dr. Swaziland knows that her blood pressure is often elevated when she comes to the physician's office however she also states that the blood pressure is elevated at home as well.  Is difficult to titrate her medication as she is on a triple combination medication Tribenzor.  I have suggested that we try to increase her dose to 40/10/25 mg daily.  She is to keep  up with her blood pressure at home.  She becomes very anxious when I asked her to do so as she states that taking her blood pressure at home makes her more anxious and her blood pressures normally very elevated when she does so.  Going to have her follow-up in 1 week to see pharmacist to evaluate blood pressure control on medication regimen which has been recently changed.  And to evaluate whether her insurance will cover the same medication at higher doses.  She is very concerned that she remains a Tier 1 on her medications.  2.  Hypercholesterolemia: Adamantly refuses statin therapy.  He prefers to use natural remedies and red yeast rice for blood pressure control along with diet.  3.  Hypothyroidism: She is followed by PCP for this.  She states the medications have been manipulated recently and her energy level has been up and down since doing so.  I have advised her to follow-up with PCP for ongoing management.  4.  History of carotid artery disease: Has post CEA.  Continues on.ASA 81 mg daily.  Current medicines are reviewed at length with the patient today.  I have spent greater than 35 minutes with this patient hearing about her symptoms and discussing medication adjustments.  Labs/ tests ordered today include: Will need follow-up BMET on next appointment.  Bettey Mare. Liborio Nixon, ANP, AACC   06/24/2017 5:17 PM    [facility] [address], [place], [place] [address] Phone: [phone]; Fax: [phone]

## 2017-06-24 ENCOUNTER — Encounter: Payer: Self-pay | Admitting: Adult Health

## 2017-06-24 ENCOUNTER — Ambulatory Visit (INDEPENDENT_AMBULATORY_CARE_PROVIDER_SITE_OTHER): Payer: MEDICARE | Admitting: Adult Health

## 2017-06-24 VITALS — BP 168/78 | HR 63 | Ht 70.5 in | Wt 149.6 lb

## 2017-06-24 DIAGNOSIS — R011 Cardiac murmur, unspecified: Secondary | ICD-10-CM

## 2017-06-24 DIAGNOSIS — I10 Essential (primary) hypertension: Secondary | ICD-10-CM | POA: Diagnosis not present

## 2017-06-24 DIAGNOSIS — I519 Heart disease, unspecified: Secondary | ICD-10-CM | POA: Diagnosis not present

## 2017-06-24 DIAGNOSIS — I350 Nonrheumatic aortic (valve) stenosis: Secondary | ICD-10-CM

## 2017-06-24 MED ORDER — OLMESARTAN-AMLODIPINE-HCTZ 40-10-25 MG PO TABS: 1.0000 | ORAL_TABLET | Freq: Every day | ORAL | 5 refills | Status: DC

## 2017-06-24 NOTE — Patient Instructions (Signed)
Medication Instructions:  INCREASE TRIBENZOR 40-10-25 DAILY If you need a refill on your cardiac medications before your next appointment, please call your pharmacy.  Testing/Procedures: Echocardiogram - Your physician has requested that you have an echocardiogram. Echocardiography is a painless test that uses sound waves to create images of your heart. It provides your doctor with information about the size and shape of your heart and how well your heart's chambers and valves are working. This procedure takes approximately one hour. There are no restrictions for this procedure. [facility] - [address], [address].  Special Instructions: FOLLOW UP BP CHECK WITH PHARMACIST-7-10 DAYS  Follow-Up: Your physician wants you to follow-up in: KEEP SCHEDULED APPT WITH DR SwazilandJORDAN    Thank you for choosing [facility]!!

## 2017-06-26 ENCOUNTER — Ambulatory Visit (HOSPITAL_COMMUNITY): Payer: MEDICARE | Attending: Cardiology

## 2017-06-26 ENCOUNTER — Other Ambulatory Visit: Payer: Self-pay

## 2017-06-26 DIAGNOSIS — I350 Nonrheumatic aortic (valve) stenosis: Secondary | ICD-10-CM | POA: Diagnosis not present

## 2017-06-26 DIAGNOSIS — I10 Essential (primary) hypertension: Secondary | ICD-10-CM | POA: Insufficient documentation

## 2017-06-26 DIAGNOSIS — I519 Heart disease, unspecified: Secondary | ICD-10-CM

## 2017-06-26 DIAGNOSIS — R011 Cardiac murmur, unspecified: Secondary | ICD-10-CM

## 2017-06-26 DIAGNOSIS — E039 Hypothyroidism, unspecified: Secondary | ICD-10-CM | POA: Diagnosis not present

## 2017-06-26 DIAGNOSIS — I480 Paroxysmal atrial fibrillation: Secondary | ICD-10-CM | POA: Insufficient documentation

## 2017-06-26 DIAGNOSIS — E785 Hyperlipidemia, unspecified: Secondary | ICD-10-CM | POA: Insufficient documentation

## 2017-07-01 ENCOUNTER — Telehealth: Payer: Self-pay | Admitting: Cardiology

## 2017-07-01 NOTE — Telephone Encounter (Signed)
New message    Patient is returning call for echo results. Patient is requesting ONLY to speak with Elnita Maxwellheryl regarding her results.

## 2017-07-01 NOTE — Telephone Encounter (Signed)
Returned call to patient echo results given. 

## 2017-07-08 ENCOUNTER — Ambulatory Visit (INDEPENDENT_AMBULATORY_CARE_PROVIDER_SITE_OTHER): Payer: MEDICARE | Admitting: Pharmacist Clinician (PhC)/ Clinical Pharmacy Specialist

## 2017-07-08 DIAGNOSIS — I10 Essential (primary) hypertension: Secondary | ICD-10-CM | POA: Diagnosis not present

## 2017-07-08 NOTE — Patient Instructions (Signed)
   Your blood pressure today is 142/78  Continue with all your medications  Continue to exercise as much as you are able.  We encourage walking daily if able.  Bring all of your meds, your BP cuff and your record of home blood pressures to your next appointment.  Exercise as you're able, try to walk approximately 30 minutes per day.  Keep salt intake to a minimum, especially watch canned and prepared boxed foods.  Eat more fresh fruits and vegetables and fewer canned items.  Avoid eating in fast food restaurants.    HOW TO TAKE YOUR BLOOD PRESSURE: . Rest 5 minutes before taking your blood pressure. .  Don't smoke or drink caffeinated beverages for at least 30 minutes before. . Take your blood pressure before (not after) you eat. . Sit comfortably with your back supported and both feet on the floor (don't cross your legs). . Elevate your arm to heart level on a table or a desk. . Use the proper sized cuff. It should fit smoothly and snugly around your bare upper arm. There should be enough room to slip a fingertip under the cuff. The bottom edge of the cuff should be 1 inch above the crease of the elbow. . Ideally, take 3 measurements at one sitting and record the average.

## 2017-07-08 NOTE — Progress Notes (Signed)
07/09/2017 Ashley Berg 08-26-1945 [id number]   HPI:  Ashley Berg is a 72 y.o. female patient of Dr Swaziland, with a PMH below who presents today for hypertension clinic evaluation.  In addition to hypertension, her medical history is significant for hyperlipidemia, carotid artery disease (s/p CEA), and hypothyroidism.  She prefers naturopathic supplements to prescription medication.  Earlier this year she had an incident of tingling in her left arm lasting several hours.  Patient took extra doses of metoprolol and tribenzor, as well as 2 baby aspirin.  She states the symptoms were relieved.    At her last visit with Dr. Kirtland Bouchard. Berg her Tribenzor dose was increased from 40/5/12.5 to 40/10/25 mg.  She notes that she has to use the bathroom more frequently, but otherwise feels fine.  She endorses that her life has been stressful for the past several years, first caring for her husband, then for her mother.  Her mother died in 2023-03-05, and she is currently finishing up the estate paperwork.   She notes that she first became hypertensive in her 20's, but only the past few years has it become harder to control.    Blood Pressure Goal:  130/80  Current Medications:  Tribenzor 40/10/25 qd (from 40/5/12.5)  Metoprolol succ 12.5 mg qd  Family Hx:  Father had stroke in his 67's, several mimi-strokes afterward, died at 40  Mother recently passed March 04, 2017), hypertension only in last year at 69  One brother with hypertension  3 sons, one with heart murmur   Social Hx:  No tobacco, no alcohol, no caffeine, no sugars,   Diet:  On fish and vegetable diet - carrots, peas, green beans, potatoes; has trouble digesting many foods; limits beans  Exercise:  None recently, caring for husband then mother; does some muscle stretches, walks in house, doesn't want to walk outdoors; previoulsy used [employer] exercise "gym"  Home BP readings:  Does not check home BP, it makes her tense when the  readings are elevated, then it will continue to go up as she stresses   Intolerances:   No antihypertensive medications Labs:   07/2016:  Na 141, K 4.1, Glu 102, BUN 22, SCr 0.77  Wt Readings from Last 3 Encounters:  06/24/17 149 lb 9.6 oz (67.9 kg)  02/07/17 150 lb 9.6 oz (68.3 kg)  12/27/16 147 lb (66.7 kg)   BP Readings from Last 3 Encounters:  07/08/17 (!) 142/78  06/24/17 (!) 168/78  02/07/17 (!) 164/68   Pulse Readings from Last 3 Encounters:  07/08/17 68  06/24/17 63  02/07/17 61    Current Outpatient Medications  Medication Sig Dispense Refill  . aspirin EC 81 MG tablet Take 1 tablet (81 mg total) by mouth daily. 30 tablet 11  . b complex vitamins tablet Take 1 tablet by mouth daily.     . digoxin (LANOXIN) 0.125 MG tablet Take by mouth daily. Take one half tablet daily    . Glucosamine-Chondroitin-MSM (TRIPLE FLEX PO) Take 1 tablet by mouth as directed.    . Homeopathic Products (SIMILASAN DRY EYE RELIEF) SOLN Place 1 drop into both eyes as needed (dry eyes).    [name] levothyroxine (SYNTHROID, LEVOTHROID) 100 MCG tablet TAKE 1 TABLET BY MOUTH DAILY BEFORE BREAKFAST. (Patient taking differently: TAKE 1/4 TABLET BY MOUTH DAILY BEFORE BREAKFAST.) 90 tablet 3  . Magnesium 400 MG CAPS Take 1 capsule by mouth 2 (two) times daily.    . metoprolol succinate (TOPROL-XL) 25 MG  24 hr tablet TAKE 1/2 (ONE HALF) TABLETS (12.5 MG TOTAL) BY MOUTH DAILY. 45 tablet 3  . NON FORMULARY Primal Lab Cardio Relax AO: Take 1 tab by mouth twice a day    . NON FORMULARY Primal MyoCor Balance: Take 1 tab by mouth twice a day    . Olmesartan-amLODIPine-HCTZ 40-10-25 MG TABS Take 1 tablet by mouth daily. 30 tablet 5  . OVER THE COUNTER MEDICATION Take 1 tablet by mouth daily. Natural thyroid support    . OVER THE COUNTER MEDICATION Take 1 capsule by mouth daily. polycosanol natural supplement    . POTASSIUM PO Take by mouth.    . Red Yeast Rice Extract (RED YEAST RICE PO) Take 1 capsule by mouth daily.     . Vitamin D, Cholecalciferol, 1000 units CAPS Take 1 capsule by mouth daily.     No current facility-administered medications for this visit.     Allergies  Allergen Reactions  . Broccoli [Brassica Oleracea Italica]     GI upset  . Chicken Protein     Does not eat meat  . Milk-Related Compounds     GI upset  . Mushroom Extract Complex     GI upset  . Onion     GI upset  . Penicillins Other (See Comments)    "skin crawls"    Past Medical History:  Diagnosis Date  . Carotid artery occlusion   . Hyperlipidemia 08/17/2013  . Hypertension   . Hypothyroidism    Refuses conventional medicine  . PAF (paroxysmal atrial fibrillation) (HCC)   . Seasonal allergies     Blood pressure (!) 142/78, pulse 68.  HTN (hypertension) Patient with essential hypertension, looks better in the office today with the recent increase in amlodipine and hctz in her Tribenzor.  She is not interested in taking further medications, and the Tribenzor is currntly at maximum doses. She is encouraged to increase her exercise, to walking daily for 20-30 minutes, as well as to continue with her daily stretching.   She was given instructions on proper home BP technique should she need to monitor at home, but assured her for now that she not need to.   We can see her again in CVRR should the need arise.     Phillips HayKristin Brittini Brubeck PharmD CPP [facility] [address] [place], [place] [id number] [phone]

## 2017-07-08 NOTE — Progress Notes (Signed)
07/08/2017 Ashley OchsJudy L Berg 07/09/1945 [id number]   HPI:  Ashley Berg is a 72 y.o. female patient of Dr SwazilandJordan, with a PMH below who presents today for hypertension clinic evaluation.  In addition to hypertension, her medical history is significant for hyperlipidemia, carotid artery disease (s/p CEA), and hypothyroidism.  She prefers naturopathic supplements to prescription medication.  Earlier this year she had an incident of tingling in her left arm lasting several hours.  Patient took extra doses of metoprolol and tribenzor, as well as 2 baby aspirin.  She states the symptoms were relieved.    Some dizziness at first, but eased off, feeling okay now, goes to bathroom more  Previously cared for husband for 15 years, then cared for mother until last year, until could no longer give aide  htn started in late 50's, fairly easy to control at first Sees chiropractor monthly  Blood Pressure Goal:  130/80  Current Medications:  Tribenzor 40/10/25 qd (from 40/5/12.5)  Metoprolol succ 12.5 mg qd  Family Hx:  Father had stroke in his 5170's, several mimi-strokes afterward, died at 3582  Mother recently passed (Dec 2018), hypertension only in last year at 6593  One brother with hypertension  3 sons, one with heart murmur   Social Hx:  No tobacco, no alcohol, no caffeine, no sugars,   Diet:  On fish and vegetable diet - carrots, peas, green beans, potatoes, ; has trouble digesting other foods or gassy foods; limits beans  Exercise:  None recently, caring for husband then mother; does some muscle stretches, walks in house, doesn't want to walk outdoors; previoulsy used [employer]  Home BP readings:  Does not check home BP, makes her tense;  Intolerances:   Labs:   07/2016:  Na 141, K 4.1, Glu 102, BUN 22, SCr 0.77   Wt Readings from Last 3 Encounters:  06/24/17 149 lb 9.6 oz (67.9 kg)  02/07/17 150 lb 9.6 oz (68.3 kg)  12/27/16 147 lb (66.7 kg)   BP Readings from Last 3  Encounters:  06/24/17 (!) 168/78  02/07/17 (!) 164/68  12/27/16 (!) 161/70   Pulse Readings from Last 3 Encounters:  06/24/17 63  02/07/17 61  12/27/16 60    Current Outpatient Medications  Medication Sig Dispense Refill  . aspirin EC 81 MG tablet Take 1 tablet (81 mg total) by mouth daily. 30 tablet 11  . b complex vitamins tablet Take 1 tablet by mouth daily.     . digoxin (LANOXIN) 0.125 MG tablet Take by mouth daily. Take one half tablet daily    . Glucosamine-Chondroitin-MSM (TRIPLE FLEX PO) Take 1 tablet by mouth as directed.    . Homeopathic Products (SIMILASAN DRY EYE RELIEF) SOLN Place 1 drop into both eyes as needed (dry eyes).    [name]. levothyroxine (SYNTHROID, LEVOTHROID) 100 MCG tablet TAKE 1 TABLET BY MOUTH DAILY BEFORE BREAKFAST. (Patient taking differently: TAKE 1/4 TABLET BY MOUTH DAILY BEFORE BREAKFAST.) 90 tablet 3  . Magnesium 400 MG CAPS Take 1 capsule by mouth 2 (two) times daily.    . metoprolol succinate (TOPROL-XL) 25 MG 24 hr tablet TAKE 1/2 (ONE HALF) TABLETS (12.5 MG TOTAL) BY MOUTH DAILY. 45 tablet 3  . NON FORMULARY Primal Lab Cardio Relax AO: Take 1 tab by mouth twice a day    . NON FORMULARY Primal MyoCor Balance: Take 1 tab by mouth twice a day    . Olmesartan-amLODIPine-HCTZ 40-10-25 MG TABS Take 1 tablet by mouth  daily. 30 tablet 5  . OVER THE COUNTER MEDICATION Take 1 tablet by mouth daily. Natural thyroid support    . OVER THE COUNTER MEDICATION Take 1 capsule by mouth daily. polycosanol natural supplement    . POTASSIUM PO Take by mouth.    . Red Yeast Rice Extract (RED YEAST RICE PO) Take 1 capsule by mouth daily.    . Vitamin D, Cholecalciferol, 1000 units CAPS Take 1 capsule by mouth daily.     No current facility-administered medications for this visit.     Allergies  Allergen Reactions  . Broccoli [Brassica Oleracea Italica]     GI upset  . Chicken Protein     Does not eat meat  . Milk-Related Compounds     GI upset  . Mushroom Extract  Complex     GI upset  . Onion     GI upset  . Penicillins Other (See Comments)    "skin crawls"    Past Medical History:  Diagnosis Date  . Carotid artery occlusion   . Hyperlipidemia 08/17/2013  . Hypertension   . Hypothyroidism    Refuses conventional medicine  . PAF (paroxysmal atrial fibrillation) (HCC)   . Seasonal allergies     There were no vitals taken for this visit.  No problem-specific Assessment & Plan notes found for this encounter.   Phillips Hay PharmD CPP [facility] [address] [place], [place] [address] [phone]

## 2017-07-09 ENCOUNTER — Encounter: Payer: Self-pay | Admitting: Pharmacist Clinician (PhC)/ Clinical Pharmacy Specialist

## 2017-07-09 NOTE — Assessment & Plan Note (Signed)
Patient with essential hypertension, looks better in the office today with the recent increase in amlodipine and hctz in her Tribenzor.  She is not interested in taking further medications, and the Tribenzor is currntly at maximum doses. She is encouraged to increase her exercise, to walking daily for 20-30 minutes, as well as to continue with her daily stretching.   She was given instructions on proper home BP technique should she need to monitor at home, but assured her for now that she not need to.   We can see her again in CV[facility] should the need arise.

## 2017-07-28 DIAGNOSIS — Z5181 Encounter for therapeutic drug level monitoring: Secondary | ICD-10-CM | POA: Diagnosis not present

## 2017-07-28 DIAGNOSIS — E7800 Pure hypercholesterolemia, unspecified: Secondary | ICD-10-CM | POA: Diagnosis not present

## 2017-07-28 DIAGNOSIS — I10 Essential (primary) hypertension: Secondary | ICD-10-CM | POA: Diagnosis not present

## 2017-07-28 DIAGNOSIS — E039 Hypothyroidism, unspecified: Secondary | ICD-10-CM | POA: Diagnosis not present

## 2017-07-28 DIAGNOSIS — E213 Hyperparathyroidism, unspecified: Secondary | ICD-10-CM | POA: Diagnosis not present

## 2017-07-29 LAB — HEPATIC FUNCTION PANEL
ALK PHOS: 92 IU/L (ref 39–117)
ALT: 23 IU/L (ref 0–32)
AST: 24 IU/L (ref 0–40)
Albumin: 5.1 g/dL — ABNORMAL HIGH (ref 3.5–4.8)
BILIRUBIN TOTAL: 0.5 mg/dL (ref 0.0–1.2)
Bilirubin, Direct: 0.18 mg/dL (ref 0.00–0.40)
Total Protein: 7.2 g/dL (ref 6.0–8.5)

## 2017-07-29 LAB — CBC WITH DIFFERENTIAL/PLATELET
Basophils Absolute: 0 10*3/uL (ref 0.0–0.2)
Basos: 0 %
EOS (ABSOLUTE): 0.2 10*3/uL (ref 0.0–0.4)
EOS: 3 %
HEMATOCRIT: 42.9 % (ref 34.0–46.6)
HEMOGLOBIN: 14.1 g/dL (ref 11.1–15.9)
Immature Grans (Abs): 0 10*3/uL (ref 0.0–0.1)
Immature Granulocytes: 0 %
LYMPHS ABS: 1.1 10*3/uL (ref 0.7–3.1)
Lymphs: 21 %
MCH: 32.6 pg (ref 26.6–33.0)
MCHC: 32.9 g/dL (ref 31.5–35.7)
MCV: 99 fL — AB (ref 79–97)
MONOS ABS: 0.4 10*3/uL (ref 0.1–0.9)
Monocytes: 8 %
NEUTROS ABS: 3.6 10*3/uL (ref 1.4–7.0)
Neutrophils: 68 %
Platelets: 208 10*3/uL (ref 150–379)
RBC: 4.33 x10E6/uL (ref 3.77–5.28)
RDW: 14.2 % (ref 12.3–15.4)
WBC: 5.3 10*3/uL (ref 3.4–10.8)

## 2017-07-29 LAB — LIPID PANEL W/O CHOL/HDL RATIO
CHOLESTEROL TOTAL: 186 mg/dL (ref 100–199)
HDL: 58 mg/dL (ref >39)
LDL Calculated: 112 mg/dL — ABNORMAL HIGH (ref 0–99)
Triglycerides: 79 mg/dL (ref 0–149)
VLDL CHOLESTEROL CAL: 16 mg/dL (ref 5–40)

## 2017-07-29 LAB — BASIC METABOLIC PANEL
BUN/Creatinine Ratio: 35 — ABNORMAL HIGH (ref 12–28)
BUN: 29 mg/dL — AB (ref 8–27)
CALCIUM: 10.8 mg/dL — AB (ref 8.7–10.3)
CHLORIDE: 100 mmol/L (ref 96–106)
CO2: 26 mmol/L (ref 20–29)
Creatinine, Ser: 0.83 mg/dL (ref 0.57–1.00)
GFR calc Af Amer: 81 mL/min/{1.73_m2} (ref >59)
GFR calc non Af Amer: 71 mL/min/{1.73_m2} (ref >59)
GLUCOSE: 101 mg/dL — AB (ref 65–99)
POTASSIUM: 4.3 mmol/L (ref 3.5–5.2)
Sodium: 142 mmol/L (ref 134–144)

## 2017-07-29 LAB — T4, FREE: Free T4: 1.27 ng/dL (ref 0.82–1.77)

## 2017-07-29 LAB — DIGOXIN LEVEL: Digoxin, Serum: 0.7 ng/mL (ref 0.5–0.9)

## 2017-07-29 LAB — TSH: TSH: 4.79 u[IU]/mL — ABNORMAL HIGH (ref 0.450–4.500)

## 2017-07-31 ENCOUNTER — Telehealth: Payer: Self-pay | Admitting: Cardiology

## 2017-07-31 NOTE — Telephone Encounter (Signed)
Returned call to patient no answer.LMTC. 

## 2017-07-31 NOTE — Telephone Encounter (Signed)
Spoke to patient lab results given. 

## 2017-07-31 NOTE — Telephone Encounter (Signed)
Follow Up: ° ° ° °Returning your call from yesterday. °

## 2017-08-03 ENCOUNTER — Other Ambulatory Visit: Payer: Self-pay | Admitting: Cardiology

## 2017-08-03 NOTE — Progress Notes (Signed)
Ashley Berg Date of Birth: 06/14/1945   History of Present Illness: Ashley Berg is seen back today for follow up HTN. She has a history of HTN and hyperlipidemia. She is s/p left CEA. She has a history of hypothyroidism.  She remains on Tribenzor and metoprolol for BP.   She does not take her blood pressure at home because it makes her feel too anxious.    On prior visit she was noted to have a chronically elevated calcium level. PTH level was mildly elevated. No history of renal stones. No fractures. Normal renal function. She was not interested in pursuing surgery. She did reduce her supplement of Vitamin D.   She was seen in April with elevated BP. Tribenzor dose was increased to maximum dose. She felt a little lightheaded for a few days then improved.   On follow up today she is doing well. Her mother did pass away in December. Her weight is stable.  She denies any palpitations, SOB, chest pain. Energy level is good.   Current Outpatient Medications on File Prior to Visit  Medication Sig Dispense Refill  . aspirin EC 81 MG tablet Take 1 tablet (81 mg total) by mouth daily. 30 tablet 11  . b complex vitamins tablet Take 1 tablet by mouth daily.     . digoxin (LANOXIN) 0.125 MG tablet TAKE 1 TABLET (0.125 MG TOTAL) DAILY BY MOUTH. 90 tablet 1  . Glucosamine-Chondroitin-MSM (TRIPLE FLEX PO) Take 1 tablet by mouth as directed.    . Homeopathic Products (SIMILASAN DRY EYE RELIEF) SOLN Place 1 drop into both eyes as needed (dry eyes).    [name] levothyroxine (SYNTHROID, LEVOTHROID) 100 MCG tablet TAKE 1 TABLET BY MOUTH DAILY BEFORE BREAKFAST. (Patient taking differently: TAKE 1/4 TABLET BY MOUTH DAILY BEFORE BREAKFAST.) 90 tablet 3  . Magnesium 400 MG CAPS Take 1 capsule by mouth 2 (two) times daily.    . metoprolol succinate (TOPROL-XL) 25 MG 24 hr tablet TAKE 1/2 (ONE HALF) TABLETS (12.5 MG TOTAL) BY MOUTH DAILY. 45 tablet 3  . NON FORMULARY Primal Lab Cardio Relax AO: Take 1 tab by mouth twice a  day    . NON FORMULARY Primal MyoCor Balance: Take 1 tab by mouth twice a day    . Olmesartan-amLODIPine-HCTZ 40-10-25 MG TABS Take 1 tablet by mouth daily. 30 tablet 5  . OVER THE COUNTER MEDICATION Take 1 tablet by mouth daily. Natural thyroid support    . OVER THE COUNTER MEDICATION Take 1 capsule by mouth daily. polycosanol natural supplement    . POTASSIUM PO Take by mouth.    . Red Yeast Rice Extract (RED YEAST RICE PO) Take 1 capsule by mouth daily.    . [DISCONTINUED] hydrochlorothiazide (MICROZIDE) 12.5 MG capsule Take 1 capsule (12.5 mg total) by mouth daily. 30 capsule 5   No current facility-administered medications on file prior to visit.     Allergies  Allergen Reactions  . Broccoli [Brassica Oleracea Italica]     GI upset  . Chicken Protein     Does not eat meat  . Milk-Related Compounds     GI upset  . Mushroom Extract Complex     GI upset  . Onion     GI upset  . Penicillins Other (See Comments)    "skin crawls"    Past Medical History:  Diagnosis Date  . Carotid artery occlusion   . Hyperlipidemia 08/17/2013  . Hypertension   . Hypothyroidism    Refuses conventional  medicine  . PAF (paroxysmal atrial fibrillation) (HCC)   . Seasonal allergies     Past Surgical History:  Procedure Laterality Date  . APPENDECTOMY    . CAROTID ENDARTERECTOMY    . CERVICAL POLYPECTOMY    . DOPPLER ECHOCARDIOGRAPHY  03/02/1999   NORMAL LEFT VENTRICULAR SIZE. EF 55%  . ENDARTERECTOMY Left 09/24/2012   Procedure: ENDARTERECTOMY CAROTID;  Surgeon: Pryor Ochoa, MD;  Location: [facility] OR;  Service: Vascular;  Laterality: Left;  . EXTERNAL EAR SURGERY Right 1983   tympanoplasty with patch  . PATCH ANGIOPLASTY Left 09/24/2012   Procedure: PATCH ANGIOPLASTY;  Surgeon: Pryor Ochoa, MD;  Location: [facility] OR;  Service: Vascular;  Laterality: Left;  . TUBAL LIGATION      Social History   Tobacco Use  Smoking Status Never Smoker  Smokeless Tobacco Never Used    Social History    Substance and Sexual Activity  Alcohol Use No  . Alcohol/week: 0.0 oz    Family History  Problem Relation Age of Onset  . Stroke Father   . Alzheimer's disease Mother     Review of Systems: As noted in HPI.   All other systems were reviewed and are negative.  Physical Exam: BP 140/72   Pulse 68   Ht 5' 10.5" (1.791 m)   Wt 145 lb 3.2 oz (65.9 kg)   BMI 20.54 kg/m  GENERAL:  Well appearing WF in NAD HEENT:  PERRL, EOMI, sclera are clear. Oropharynx is clear. NECK:  No jugular venous distention, carotid upstroke brisk and symmetric, no bruits, no thyromegaly or adenopathy LUNGS:  Clear to auscultation bilaterally CHEST:  Unremarkable HEART:  RRR,  PMI not displaced or sustained,S1 and S2 within normal limits, no S3, no S4: no clicks, no rubs, no murmurs ABD:  Soft, nontender. BS +, no masses or bruits. No hepatomegaly, no splenomegaly EXT:  2 + pulses throughout, no edema, no cyanosis no clubbing SKIN:  Warm and dry.  No rashes NEURO:  Alert and oriented x 3. Cranial nerves II through XII intact. PSYCH:  Cognitively intact    LABORATORY DATA:    Lab Results  Component Value Date   WBC 5.3 07/28/2017   HGB 14.1 07/28/2017   HCT 42.9 07/28/2017   PLT 208 07/28/2017   GLUCOSE 101 (H) 07/28/2017   CHOL 186 07/28/2017   TRIG 79 07/28/2017   HDL 58 07/28/2017   LDLDIRECT 144.4 09/08/2012   LDLCALC 112 (H) 07/28/2017   ALT 23 07/28/2017   AST 24 07/28/2017   NA 142 07/28/2017   K 4.3 07/28/2017   CL 100 07/28/2017   CREATININE 0.83 07/28/2017   BUN 29 (H) 07/28/2017   CO2 26 07/28/2017   TSH 4.790 (H) 07/28/2017   INR 1.00 09/22/2012    Assessment / Plan: 1. Carotid arterial disease s/p left CEA.  Continue ASA. Dopplers in October 2018 showed 40-59% RICA disease and no restenosis on the left. Refuses to take statin. On red yeast rice.   2. HTN - continue higher dose of Tribenzor  and  Toprol XL 12.5 mg daily. She is tolerating this well.  BP is well  controlled today.  3. Hyperlipidemia. Does not want to take statin. On red yeast rice. Lipids are fair  4. Paroxysmal AFib- remote history - no symptomatic recurrence. Will reduce digoxin to 0.125 mg daily. Check dig level next visit.   5. Hypothyroidism on synthroid. Last TSH mildly elevated but free T4 normal. Would keep her on same  dose. Repeat labs in 6 months.  6. Hypercalcemia. PTH consistent with mild hyperparathyroidism. Calcium levels stable. She is asymptomatic.   I will follow up in 6 months with lab work.

## 2017-08-06 ENCOUNTER — Encounter: Payer: Self-pay | Admitting: Cardiology

## 2017-08-06 ENCOUNTER — Ambulatory Visit (INDEPENDENT_AMBULATORY_CARE_PROVIDER_SITE_OTHER): Payer: MEDICARE | Admitting: Cardiology

## 2017-08-06 VITALS — BP 140/72 | HR 68 | Ht 70.5 in | Wt 145.2 lb

## 2017-08-06 DIAGNOSIS — E7800 Pure hypercholesterolemia, unspecified: Secondary | ICD-10-CM | POA: Diagnosis not present

## 2017-08-06 DIAGNOSIS — I10 Essential (primary) hypertension: Secondary | ICD-10-CM | POA: Diagnosis not present

## 2017-08-06 DIAGNOSIS — E213 Hyperparathyroidism, unspecified: Secondary | ICD-10-CM

## 2017-08-06 DIAGNOSIS — E039 Hypothyroidism, unspecified: Secondary | ICD-10-CM

## 2017-08-06 NOTE — Patient Instructions (Signed)
Continue your current therapy  I will see you in 6 months.   

## 2017-09-06 ENCOUNTER — Other Ambulatory Visit: Payer: Self-pay | Admitting: Cardiology

## 2017-09-11 ENCOUNTER — Other Ambulatory Visit: Payer: Self-pay

## 2017-09-11 ENCOUNTER — Telehealth: Payer: Self-pay | Admitting: Cardiology

## 2017-09-11 ENCOUNTER — Encounter: Payer: Self-pay | Admitting: Family Medicine

## 2017-09-11 ENCOUNTER — Ambulatory Visit (INDEPENDENT_AMBULATORY_CARE_PROVIDER_SITE_OTHER): Payer: MEDICARE | Admitting: Family Medicine

## 2017-09-11 ENCOUNTER — Telehealth: Payer: Self-pay | Admitting: *Deleted

## 2017-09-11 VITALS — BP 142/62 | HR 61 | Temp 98.3°F | Resp 16 | Ht 69.25 in | Wt 147.4 lb

## 2017-09-11 DIAGNOSIS — L309 Dermatitis, unspecified: Secondary | ICD-10-CM | POA: Diagnosis not present

## 2017-09-11 DIAGNOSIS — I10 Essential (primary) hypertension: Secondary | ICD-10-CM

## 2017-09-11 DIAGNOSIS — L239 Allergic contact dermatitis, unspecified cause: Secondary | ICD-10-CM | POA: Diagnosis not present

## 2017-09-11 DIAGNOSIS — L568 Other specified acute skin changes due to ultraviolet radiation: Secondary | ICD-10-CM

## 2017-09-11 MED ORDER — CETIRIZINE HCL 10 MG PO TABS: 10.0000 mg | ORAL_TABLET | Freq: Every day | ORAL | 0 refills | Status: DC

## 2017-09-11 MED ORDER — OLMESARTAN-AMLODIPINE-HCTZ 40-10-25 MG PO TABS: 1.0000 | ORAL_TABLET | Freq: Every day | ORAL | Status: DC

## 2017-09-11 MED ORDER — TRIAMCINOLONE ACETONIDE 0.025 % EX CREA: 1.0000 "application " | TOPICAL_CREAM | Freq: Two times a day (BID) | CUTANEOUS | 0 refills | Status: DC

## 2017-09-11 NOTE — Telephone Encounter (Signed)
She has been on Tribenzor for a long time and although the dose was increased the ingredients are the same. I doubt this is related to her Tribenzor and would continue on it.  Peter SwazilandJordan MD, [facility]

## 2017-09-11 NOTE — Progress Notes (Signed)
I,Ashley Berg,acting as a scribe for Ashley Mocha, MD.,have documented all relevant documentation on the behalf of Ashley Mocha, MD,as directed by  Ashley Mocha, MD while in the presence of Ashley Mocha, MD. 3:15 pm 09/11/2017  Subjective:    Patient ID: Ashley Berg, female    DOB: Aug 24, 1945, 72 y.o.   MRN: [id number]  Chief Complaint  Patient presents with  . Establish Care  . Rash    x 2 weeks on chest and neck with itching    HPI Ashley Berg is a 72 y.o. female who presents to Primary Care at [facility] complaining of a rash that developed on her chest that started about 2 weeks ago. The rash itches and slowly spread to her neck. She is taking hydrochlorothiazide but she has been switched to different doses within the last few weeks. She notes that the 12.5 mg dose never bothered her. She just received a refill for 40-5-12.5 mg olmesartan-amlodipine-HCTZ tablets. This rash is located at the opening on her shirt in a sun-exposed area. She denies being outside a lot. She notes that her left foot has been swollen, but this resolves at night. She denies foot pain, but this occurs when she walks. She denies swelling in he left foot. She was previously on 40-10-25 mg but per Dr. Swaziland she was supposed to continue this dose instead of the 40-5-12.5 mg dose.  Ashley Berg reports that she is finding her new normal after the death of several family members including her husband. She notes that on 06/16/2017, her left arm was going numb. She followed up with her cardiologist Dr. Bailey Berg and Dr. Peter Berg. She notes that her BP was 168 while she was there. She feels that going to the doctor and dealing with medicaid can be stressful.    Patient Active Problem List   Diagnosis Date Noted  . Hyperparathyroidism (HCC) 02/01/2016  . Carotid arterial disease (HCC) 01/12/2014  . Hyperlipidemia 08/17/2013  . Aftercare following surgery of the circulatory system, NEC 04/20/2013  . Occlusion and  stenosis of carotid artery without mention of cerebral infarction 09/22/2012  . HTN (hypertension) 08/23/2010  . Hypothyroidism 08/23/2010   Past Medical History:  Diagnosis Date  . Carotid artery occlusion   . Hyperlipidemia 08/17/2013  . Hypertension   . Hypothyroidism    Refuses conventional medicine  . PAF (paroxysmal atrial fibrillation) (HCC)   . Seasonal allergies    Past Surgical History:  Procedure Laterality Date  . APPENDECTOMY    . CAROTID ENDARTERECTOMY    . CERVICAL POLYPECTOMY    . DOPPLER ECHOCARDIOGRAPHY  03/02/1999   NORMAL LEFT VENTRICULAR SIZE. EF 55%  . ENDARTERECTOMY Left 09/24/2012   Procedure: ENDARTERECTOMY CAROTID;  Surgeon: Ashley Ochoa, MD;  Location: [facility] OR;  Service: Vascular;  Laterality: Left;  . EXTERNAL EAR SURGERY Right 1983   tympanoplasty with patch  . PATCH ANGIOPLASTY Left 09/24/2012   Procedure: PATCH ANGIOPLASTY;  Surgeon: Ashley Ochoa, MD;  Location: [facility] OR;  Service: Vascular;  Laterality: Left;  . TUBAL LIGATION     Allergies  Allergen Reactions  . Broccoli [Brassica Oleracea Italica]     GI upset  . Chicken Protein     Does not eat meat  . Milk-Related Compounds     GI upset  . Mushroom Extract Complex     GI upset  . Onion     GI upset  . Penicillins Other (See Comments)    "  skin crawls"   Prior to Admission medications   Medication Sig Start Date End Date Taking? Authorizing Provider  aspirin EC 81 MG tablet Take 1 tablet (81 mg total) by mouth daily. 09/11/12  Yes SwazilandJordan, Ashley M, MD  b complex vitamins tablet Take 1 tablet by mouth daily.    Yes [provider]  Cholecalciferol (VITAMIN D3 PO) Take 1,000 mg by mouth.   Yes [provider]  digoxin (LANOXIN) 0.125 MG tablet TAKE 1 TABLET (0.125 MG TOTAL) DAILY BY MOUTH. 08/05/17  Yes SwazilandJordan, Ashley M, MD  Glucosamine-Chondroitin-MSM (TRIPLE FLEX PO) Take 1 tablet by mouth as directed.   Yes [provider]  Homeopathic Products [facility](SIMILASAN DRY EYE  RELIEF) SOLN Place 1 drop into both eyes as needed (dry eyes).   Yes [provider]  levothyroxine (SYNTHROID, LEVOTHROID) 100 MCG tablet TAKE 1 TABLET BY MOUTH DAILY BEFORE BREAKFAST. Patient taking differently: TAKE 1/4 TABLET BY MOUTH DAILY BEFORE BREAKFAST. 11/15/16  Yes SwazilandJordan, Ashley M, MD  Magnesium 400 MG CAPS Take 1 capsule by mouth 2 (two) times daily.   Yes [provider]  metoprolol succinate (TOPROL-XL) 25 MG 24 hr tablet TAKE 1/2 (ONE HALF) TABLETS (12.5 MG TOTAL) BY MOUTH DAILY. 01/17/17  Yes SwazilandJordan, Ashley M, MD  NON FORMULARY Primal Lab Cardio Relax AO: Take 1 tab by mouth twice a day   Yes [provider]  NON FORMULARY Primal MyoCor Balance: Take 1 tab by mouth twice a day   Yes [provider]  Olmesartan-amLODIPine-HCTZ 40-5-12.5 MG TABS TAKE 1 TABLET BY MOUTH EVERY DAY 09/08/17  Yes SwazilandJordan, Ashley M, MD  OVER THE COUNTER MEDICATION Take 1 tablet by mouth daily. Natural thyroid support   Yes [provider]  OVER THE COUNTER MEDICATION Take 1 capsule by mouth daily. polycosanol natural supplement   Yes [provider]  POTASSIUM PO Take by mouth.   Yes [provider]  Red Yeast Rice Extract (RED YEAST RICE PO) Take 1 capsule by mouth daily.   Yes [provider]  hydrochlorothiazide (MICROZIDE) 12.5 MG capsule Take 1 capsule (12.5 mg total) by mouth daily. 12/25/10 06/14/11  SwazilandJordan, Ashley M, MD   Social History   Socioeconomic History  . Marital status: Widowed    Spouse name: Not on file  . Number of children: Not on file  . Years of education: Not on file  . Highest education level: Not on file  Occupational History  . Not on file  Social Needs  . Financial resource strain: Not on file  . Food insecurity:    Worry: Not on file    Inability: Not on file  . Transportation needs:    Medical: Not on file    Non-medical: Not on file  Tobacco Use  . Smoking status: Never Smoker  . Smokeless tobacco:  Never Used  Substance and Sexual Activity  . Alcohol use: No    Alcohol/week: 0.0 oz  . Drug use: No  . Sexual activity: Not on file  Lifestyle  . Physical activity:    Days per week: Not on file    Minutes per session: Not on file  . Stress: Not on file  Relationships  . Social connections:    Talks on phone: Not on file    Gets together: Not on file    Attends religious service: Not on file    Active member of club or organization: Not on file    Attends meetings of  clubs or organizations: Not on file    Relationship status: Not on file  . Intimate partner violence:    Fear of current or ex partner: Not on file    Emotionally abused: Not on file    Physically abused: Not on file    Forced sexual activity: Not on file  Other Topics Concern  . Not on file  Social History Narrative  . Not on file      Review of Systems  Constitutional: Negative.  Negative for fatigue and fever.  HENT: Negative for congestion, ear pain, rhinorrhea, sinus pain and sore throat.   Eyes: Negative.   Respiratory: Negative for cough, chest tightness and shortness of breath.   Cardiovascular: Positive for leg swelling. Negative for chest pain.  Gastrointestinal: Negative.  Negative for abdominal pain and constipation.  Genitourinary: Negative.  Negative for dysuria and hematuria.  Neurological: Negative.  Negative for dizziness, light-headedness and headaches.  All other systems reviewed and are negative.      Objective:   Physical Exam  HENT:  Right Ear: Tympanic membrane is injected and retracted.  Left Ear: Tympanic membrane normal.  Neck: No thyromegaly present.  Rash in the upper chest and neck- photo below (09/11/2017)  Cardiovascular: Normal rate and regular rhythm.  Pulmonary/Chest: Effort normal and breath sounds normal.  Abdominal: Soft. Normal appearance and bowel sounds are normal.  Lymphadenopathy:    She has cervical adenopathy.      Vitals:   09/11/17 1433  BP: (!)  142/62  Pulse: 61  Resp: 16  Temp: 98.3 F (36.8 C)  TempSrc: Oral  SpO2: 99%  Weight: 147 lb 6.4 oz (66.9 kg)  Height: 5' 9.25" (1.759 Berg)       Assessment & Plan:   1. Dermatitis   2. Photosensitive contact dermatitis   3. Allergic dermatitis   4. Essential hypertension     Meds ordered this encounter  Medications  . cetirizine (ZYRTEC) 10 MG tablet    Sig: Take 1 tablet (10 mg total) by mouth at bedtime.    Dispense:  30 tablet    Refill:  0  . triamcinolone (KENALOG) 0.025 % cream    Sig: Apply 1 application topically 2 (two) times daily. To chest    Dispense:  80 g    Refill:  0   I personally performed the services described in this documentation, which was scribed in my presence. The recorded information has been reviewed and considered, and addended by me as needed.   Norberto Sorenson, Berg.D.  Primary Care at [facility] [address] [place], [place] [address] [phone] phone [phone] fax  01/01/18 5:53 AM

## 2017-09-11 NOTE — Telephone Encounter (Signed)
Per pt was told by PMD that could possibly be having an allergic reaction to Tribenzor  Has rash to chest radiating to neck also notes left foot swelling.Per pt  PMD has given scripts for allergy cream as well as pill and ask that pt wait a week and if no better call back Pmd  feels that the increase in the Tribenzor is the culprit .Will forward to Dr SwazilandJOrdan for review .Zack Seal/cy

## 2017-09-11 NOTE — Telephone Encounter (Signed)
Pt calling   Pt stated she has a rash on her chest and think it may be coming from one of her medication. Please call pt to discuss

## 2017-09-11 NOTE — Patient Instructions (Addendum)
Call Dr. Elvis CoilJordan's office - from your visit last month, it looks like Dr. SwazilandJordan wanted you to stay on the "high dose" Tribenzor - 40-10-25 dose.  However, when it was refilled on 6/17 (request request was submitted on 6/15), for some reason the ordering CMA nurse doing refills stopped the new high dose you were on and changed it back to your prior dose of 40-5-12.5 - I do not see any explanation in your chart about why this was done so I recommend giving cardiology office a call to just make sure that this was an intentional choice by Dr. SwazilandJordan - and perhaps why it was done - and make sure it wasn't just an unintentional oversight - or error - like the pharmacy might have accidentally submitted an order for a refill on the old rx and not seen the new one with the new dose in their system.  . . .   Try the cetirizine every night before bed and appy the mild steroid cream twice a day - if no improvement in 1 week, then I think we should do a trial off of the hctz for several weeks as this medication can occasionally cause a "photosensative dermatitis" even when you have been on it for years.  We could call you in just the olmesartan-amlodipine alone to use in the meantime and could always give a separate rx for lasix (furosemide as needed for edema in the interim until we figured out if the hctz was the culprit or not.   IF you received an x-ray today, you will receive an invoice from [facility]. Please contact [facility] at [phone] with questions or concerns regarding your invoice.   IF you received labwork today, you will receive an invoice from [facility]. Please contact [facility] at [phone] with questions or concerns regarding your invoice.   Our billing staff will not be able to assist you with questions regarding bills from these companies.  You will be contacted with the lab results as soon as they are available. The fastest way to get your results is to activate your My  Chart account. Instructions are located on the last page of this paperwork. If you have not heard from us regarding the results in 2 weeks, please contact this office.     Hives Hives (urticaria) are itchy, red, swollen areas on your skin. Hives can appear on any part of your body and can vary in size. They can be as small as the tip of a pen or much larger. Hives often fade within 24 hours (acute hives). In other cases, new hives appear after old ones fade. This cycle can continue for several days or weeks (chronic hives). Hives result from your body's reaction to an irritant or to something that you are allergic to (trigger). When you are exposed to a trigger, your body releases a chemical (histamine) that causes redness, itching, and swelling. You can get hives immediately after being exposed to a trigger or hours later. Hives do not spread from person to person (are not contagious). Your hives may get worse with scratching, exercise, and emotional stress. What are the causes? Causes of this condition include:  Allergies to certain foods or ingredients.  Insect bites or stings.  Exposure to pollen or pet dander.  Contact with latex or chemicals.  Spending time in sunlight, heat, or cold (exposure).  Exercise.  Stress.  You can also get hives from some medical conditions and treatments. These include:  Viruses, including  the common cold.  Bacterial infections, such as urinary tract infections and strep throat.  Disorders such as vasculitis, lupus, or thyroid disease.  Certain medications.  Allergy shots.  Blood transfusions.  Sometimes, the cause of hives is not known (idiopathic hives). What increases the risk? This condition is more likely to develop in:  Women.  People who have food allergies, especially to citrus fruits, milk, eggs, peanuts, tree nuts, or shellfish.  People who are allergic to: ? Medicines. ? Latex. ? Insects. ? Animals. ? Pollen.  People  who have certain medical conditions, includinglupus or thyroid disease.  What are the signs or symptoms? The main symptom of this condition is raised, itchyred or white bumps or patches on your skin. These areas may:  Become large and swollen (welts).  Change in shape and location, quickly and repeatedly.  Be separate hives or connect over a large area of skin.  Sting or become painful.  Turn white when pressed in the center (blanch).  In severe cases, yourhands, feet, and face may also become swollen. This may occur if hives develop deeper in your skin. How is this diagnosed? This condition is diagnosed based on your symptoms, medical history, and physical exam. Your skin, urine, or blood may be tested to find out what is causing your hives and to rule out other health issues. Your health care provider may also remove a small sample of skin from the affected area and examine it under a microscope (biopsy). How is this treated? Treatment depends on the severity of your condition. Your health care provider may recommend using cool, wet cloths (cool compresses) or taking cool showers to relieve itching. Hives are sometimes treated with medicines, including:  Antihistamines.  Corticosteroids.  Antibiotics.  An injectable medicine (omalizumab). Your health care provider may prescribe this if you have chronic idiopathic hives and you continue to have symptoms even after treatment with antihistamines.  Severe cases may require an emergency injection of adrenaline (epinephrine) to prevent a life-threatening allergic reaction (anaphylaxis). Follow these instructions at home: Medicines  Take or apply over-the-counter and prescription medicines only as told by your health care provider.  If you were prescribed an antibiotic medicine, use it as told by your health care provider. Do not stop taking the antibiotic even if you start to feel better. Skin Care  Apply cool compresses to the  affected areas.  Do not scratch or rub your skin. General instructions  Do not take hot showers or baths. This can make itching worse.  Do not wear tight-fitting clothing.  Use sunscreen and wear protective clothing when you are outside.  Avoid any substances that cause your hives. Keep a journal to help you track what causes your hives. Write down: ? What medicines you take. ? What you eat and drink. ? What products you use on your skin.  Keep all follow-up visits as told by your health care provider. This is important. Contact a health care provider if:  Your symptoms are not controlled with medicine.  Your joints are painful or swollen. Get help right away if:  You have a fever.  You have pain in your abdomen.  Your tongue or lips are swollen.  Your eyelids are swollen.  Your chest or throat feels tight.  You have trouble breathing or swallowing. These symptoms may represent a serious problem that is an emergency. Do not wait to see if the symptoms will go away. Get medical help right away. Call your local emergency services (  911 in the [place] Do not drive yourself to the hospital. This information is not intended to replace advice given to you by your health care provider. Make sure you discuss any questions you have with your health care provider. Document Released: 03/11/2005 Document Revised: 08/09/2015 Document Reviewed: 12/28/2014 Elsevier Interactive Patient Education  2018 [employer].  Drug Rash A drug rash is a change in the color or texture of the skin that is caused by a drug. It can develop minutes, hours, or days after the person takes the drug. What are the causes? This condition is usually caused by a drug allergy. It can also be caused by exposure to sunlight after taking a drug that makes the skin sensitive to light. Drugs that commonly cause rashes include:  Penicillin.  Antibiotic medicines.  Medicines that treat seizures.  Medicines that  treat cancer (chemotherapy).  Aspirin and other nonsteroidal anti-inflammatory drugs (NSAIDs).  Injectable dyes that contain iodine.  Insulin.  What are the signs or symptoms? Symptoms of this condition include:  Redness.  Tiny bumps.  Peeling.  Itching.  Itchy welts (hives).  Swelling.  The rash may appear on a small area of skin or all over the body. How is this diagnosed? To diagnose the condition, your health care provider will do a physical exam. He or she may also order tests to find out which drug caused the rash. Tests to find the cause of a rash include:  Skin tests.  Blood tests.  Drug challenge. For this test, you stop taking all of the drugs that you do not need to take, and then you start taking them again by adding back one of the drugs at a time.  How is this treated? A drug rash may be treated with medicines, including:  Antihistamines. These may be given to relieve itching.  An NSAID. This may be given to reduce swelling and treat pain.  A steroid drug. This may be given to reduce swelling.  The rash usually goes away when the person stops taking the drug that caused it. Follow these instructions at home:  Take medicines only as directed by your health care provider.  Let all of your health care providers know about any drug reactions you have had in the past.  If you have hives, take a cool shower or use a cool compress to relieve itchiness. Contact a health care provider if:  You have a fever.  Your rash is not going away.  Your rash gets worse.  Your rash comes back.  You have wheezing or coughing. Get help right away if:  You start to have breathing problems.  You start to have shortness of breath.  You face or throat starts to swell.  You have severe weakness with dizziness or fainting.  You have chest pain. This information is not intended to replace advice given to you by your health care provider. Make sure you discuss  any questions you have with your health care provider. Document Released: 04/18/2004 Document Revised: 08/17/2015 Document Reviewed: 01/05/2014 Elsevier Interactive Patient Education  [employer].

## 2017-09-12 MED ORDER — OLMESARTAN-AMLODIPINE-HCTZ 40-10-25 MG PO TABS: 1.0000 | ORAL_TABLET | Freq: Every day | ORAL | 3 refills | Status: DC

## 2017-09-12 NOTE — Telephone Encounter (Signed)
Spoke with pt and advised that per Dr. SwazilandJordan he doesn't feel that the reaction is related to the Tribenzor as she has been taking the medication for quite some time. Pt verbalized understanding.

## 2017-09-12 NOTE — Telephone Encounter (Signed)
See previous encounter

## 2017-09-12 NOTE — Telephone Encounter (Signed)
Left message to call back  

## 2017-09-18 ENCOUNTER — Telehealth: Payer: Self-pay | Admitting: Cardiology

## 2017-09-18 MED ORDER — OLMESARTAN-AMLODIPINE-HCTZ 40-10-25 MG PO TABS: 1.0000 | ORAL_TABLET | Freq: Every day | ORAL | 3 refills | Status: DC

## 2017-09-18 NOTE — Telephone Encounter (Signed)
New Message   Patient is calling because she has some questions about medication and a refill. She did not really want to provide any additional information because she says its complex. Please call to discuss.

## 2017-09-18 NOTE — Telephone Encounter (Signed)
Script phoned in at pt's request ./cy

## 2017-09-18 NOTE — Telephone Encounter (Signed)
Lm to call back ./cy 

## 2017-09-18 NOTE — Telephone Encounter (Signed)
Follow Up:      Returning your call from today. 

## 2017-10-08 ENCOUNTER — Other Ambulatory Visit: Payer: Self-pay | Admitting: Family Medicine

## 2017-10-31 ENCOUNTER — Telehealth: Payer: Self-pay | Admitting: Family Medicine

## 2017-11-25 ENCOUNTER — Telehealth: Payer: Self-pay | Admitting: Family Medicine

## 2017-11-25 NOTE — Telephone Encounter (Signed)
Called pt. To inform them of Dr. Shaw's leave of absence. Left VM  °

## 2017-11-26 NOTE — Telephone Encounter (Signed)
**  Patient needs to reschedule their apt on 12/01/17 with a different provider or get an appointment with Dr Shaw when she comes back in Oct. If she only has same day slots please call the clinic and ask for Caitlin so I can open a slot for you.**CC °

## 2017-12-02 ENCOUNTER — Encounter: Payer: MEDICARE | Admitting: Family Medicine

## 2018-01-08 ENCOUNTER — Encounter

## 2018-01-08 ENCOUNTER — Ambulatory Visit (INDEPENDENT_AMBULATORY_CARE_PROVIDER_SITE_OTHER): Payer: MEDICARE | Admitting: Family

## 2018-01-08 ENCOUNTER — Encounter: Payer: Self-pay | Admitting: Family

## 2018-01-08 ENCOUNTER — Ambulatory Visit (HOSPITAL_COMMUNITY)
Admission: RE | Admit: 2018-01-08 | Discharge: 2018-01-08 | Disposition: A | Discharge status: Home / Self Care | Payer: MEDICARE | Source: Ambulatory Visit | Attending: Family | Admitting: Family

## 2018-01-08 VITALS — BP 150/75 | HR 64 | Temp 96.9°F | Resp 17 | Ht 69.25 in | Wt 146.0 lb

## 2018-01-08 DIAGNOSIS — Z7722 Contact with and (suspected) exposure to environmental tobacco smoke (acute) (chronic): Secondary | ICD-10-CM

## 2018-01-08 DIAGNOSIS — Z9889 Other specified postprocedural states: Secondary | ICD-10-CM

## 2018-01-08 DIAGNOSIS — I6522 Occlusion and stenosis of left carotid artery: Secondary | ICD-10-CM

## 2018-01-08 NOTE — Progress Notes (Signed)
Chief Complaint: Follow up Extracranial Carotid Artery Stenosis   History of Present Illness  Ashley Berg is a 72 y.o. female who is s/p left carotid endarterectomy July , 2014 by Dr. Hart Rochester.  Patient was asymptomatic preoperatively and remains asymptomatic. She has no complaints including lateralizing weakness, aphasia, procedures, diplopia, blurred vision, or syncope. She also denies any significant claudication symptoms in her lower extremities. She has no history of coronary artery disease. She does have a history of atrial fibrillation and was treated with digoxin and is followed by Dr. Peter Berg. She takes daily aspirin.  She denies claudication type symptoms in her legs with walking.   Pt states her blood pressure increases in a doctor office but does not check her blood pressure at home.  She states Dr. Swaziland has been monitoring her PTH and calcium levels. Pt states she has been taking 5000 IU of vitamin D for a few years, she decreased the intake to 1000 mg monthly. She may need her vitamin D levels checked, which affects the PTH and calcium levels.    She has a new PCP which she saw in April 2019, Dr. Norberto Berg.  Pt's mother was in a Hospice program, died in 15-Mar-2017.   Pt Diabetic: no Pt smoker: non-smoker, but she was exposed to her husband's second hand smoke for 32 years. He died in Aug 14, 2007.  Pt meds include: Statin : no, on Red Yeast Rice and polycosanol ASA: yes Other anticoagulants/antiplatelets: no    Past Medical History:  Diagnosis Date  . Carotid artery occlusion   . Hyperlipidemia 08/17/2013  . Hypertension   . Hypothyroidism    Refuses conventional medicine  . PAF (paroxysmal atrial fibrillation) (HCC)   . Seasonal allergies     Social History Social History   Tobacco Use  . Smoking status: Never Smoker  . Smokeless tobacco: Never Used  Substance Use Topics  . Alcohol use: No    Alcohol/week: 0.0 standard drinks  . Drug use: No     Family History Family History  Problem Relation Age of Onset  . Stroke Father   . Alzheimer's disease Mother     Surgical History Past Surgical History:  Procedure Laterality Date  . APPENDECTOMY    . CAROTID ENDARTERECTOMY    . CERVICAL POLYPECTOMY    . DOPPLER ECHOCARDIOGRAPHY  03-16-99   NORMAL LEFT VENTRICULAR SIZE. EF 55%  . ENDARTERECTOMY Left 09/24/2012   Procedure: ENDARTERECTOMY CAROTID;  Surgeon: Ashley Ochoa, MD;  Location[facility] OR;  Service: Vascular;  Laterality: Left;  . EXTERNAL EAR SURGERY Right 1983   tympanoplasty with patch  . PATCH ANGIOPLASTY Left 09/24/2012   Procedure: PATCH ANGIOPLASTY;  Surgeon: Ashley Ochoa, MD;  Location: [facility] OR;  Service: Vascular;  Laterality: Left;  . TUBAL LIGATION      Allergies  Allergen Reactions  . Broccoli [Brassica Oleracea Italica]     GI upset  . Chicken Protein     Does not eat meat  . Milk-Related Compounds     GI upset  . Mushroom Extract Complex     GI upset  . Onion     GI upset  . Penicillins Other (See Comments)    "skin crawls"    Current Outpatient Medications  Medication Sig Dispense Refill  . aspirin EC 81 MG tablet Take 1 tablet (81 mg total) by mouth daily. 30 tablet 11  . b complex vitamins tablet Take 1 tablet by mouth daily.     [name]  cetirizine (ZYRTEC) 10 MG tablet TAKE 1 TABLET BY MOUTH EVERYDAY AT BEDTIME 30 tablet 0  . Cholecalciferol (VITAMIN D3 PO) Take 1,000 mg by mouth.    . digoxin (LANOXIN) 0.125 MG tablet TAKE 1 TABLET (0.125 MG TOTAL) DAILY BY MOUTH. 90 tablet 1  . Glucosamine-Chondroitin-MSM (TRIPLE FLEX PO) Take 1 tablet by mouth as directed.    . Homeopathic Products (SIMILASAN DRY EYE RELIEF) SOLN Place 1 drop into both eyes as needed (dry eyes).    [name] levothyroxine (SYNTHROID, LEVOTHROID) 100 MCG tablet TAKE 1 TABLET BY MOUTH DAILY BEFORE BREAKFAST. (Patient taking differently: TAKE 1/4 TABLET BY MOUTH DAILY BEFORE BREAKFAST.) 90 tablet 3  . Magnesium 400 MG CAPS Take 1 capsule  by mouth 2 (two) times daily.    . metoprolol succinate (TOPROL-XL) 25 MG 24 hr tablet TAKE 1/2 (ONE HALF) TABLETS (12.5 MG TOTAL) BY MOUTH DAILY. 45 tablet 3  . NON FORMULARY Primal Lab Cardio Relax AO: Take 1 tab by mouth twice a day    . NON FORMULARY Primal MyoCor Balance: Take 1 tab by mouth twice a day    . Olmesartan-amLODIPine-HCTZ 40-10-25 MG TABS Take 1 tablet by mouth daily. 90 tablet 3  . OVER THE COUNTER MEDICATION Take 1 tablet by mouth daily. Natural thyroid support    . OVER THE COUNTER MEDICATION Take 1 capsule by mouth daily. polycosanol natural supplement    . POTASSIUM PO Take by mouth.    . Red Yeast Rice Extract (RED YEAST RICE PO) Take 1 capsule by mouth daily.    [name] triamcinolone (KENALOG) 0.025 % cream Apply 1 application topically 2 (two) times daily. To chest 80 g 0   No current facility-administered medications for this visit.     Review of Systems : See HPI for pertinent positives and negatives.  Physical Examination  Vitals:   01/08/18 1533 01/08/18 1536  BP: (!) 162/76 (!) 150/75  Pulse: 65 64  Resp: 17   Temp: (!) 96.9 F (36.1 C)   TempSrc: Oral   SpO2: 100%   Weight: 146 lb (66.2 kg)   Height: 5' 9.25" (1.759 m)    Body mass index is 21.41 kg/m.  General: WDWN female in NAD GAIT: normal Eyes: PERRLA HENT: No gross abnormalities.  Pulmonary:  Respirations are non-labored, good air movement in all fields, CTAB, no rales, rhonchi, or wheezes Cardiac: regular rhythm, no detected murmur.  VASCULAR EXAM Carotid Bruits Right Left   Negative Negative     Abdominal aortic pulse is not palpable. Radial pulses are 2+ palpable and equal.                                                                                                                            LE Pulses Right Left       POPLITEAL  not palpable   not palpable       POSTERIOR TIBIAL  1+ palpable   1+ palpable  DORSALIS PEDIS      ANTERIOR TIBIAL 1+ palpable  1+ palpable      Gastrointestinal: soft, nontender, BS WNL, no r/g, no palpable masses. Musculoskeletal: no muscle atrophy/wasting. M/S 5/5 throughout, extremities without ischemic changes. Skin: No rashes, no ulcers, no cellulitis.   Neurologic:  A&O X 3; appropriate affect, sensation is normal; speech is normal, CN 2-12 intact, pain and light touch intact in extremities, motor exam as listed above. Psychiatric: Normal thought content, mood appropriate to clinical situation.    Assessment: AAYRA HORNBAKER is a 72 y.o. female who is s/p left carotid endarterectomy on 09/24/2012. She has no history of stroke or TIA.   Dr. Swaziland has been monitoring her PTH and calcium levels. Pt states she has been taking 5000 IU of vitamin D3 for a few years, she decreased the intake to 1000 mg monthly. She may need her vitamin D levels checked, which affects the PTH and calcium levels, will defer to her PCP or Dr. Swaziland.  Fortunately she does not have DM and has never used tobacco, but she was exposed to her husband's second hand smoke for 32 years. He died in 08/23/07.  She has a normal BMI and stays physically active. She takes a daily ASA and Red Yeast Yeast Rice (does not want to take a statin).    DATA  Carotid Duplex (01-08-18): Right ICA: 1-39% stenosis Left ICA : CEA site with 1-39% stenosis Bilateral vertebral artery flow is antegrade.  Bilateral subclavian artery waveforms are normal.  Less stenosis in the right ICA compared to the exam on 12-27-16, but same as the exam on 12-19-15.   Plan: Follow-up in 18 monthswith Carotid Duplex scan.   I discussed in depth with the patient the nature of atherosclerosis, and emphasized the importance of maximal medical management including strict control of blood pressure, blood glucose, and lipid levels, obtaining regular exercise, and continued cessation of smoking.  The patient is aware that without maximal medical management the underlying atherosclerotic disease  process will progress, limiting the benefit of any interventions. The patient was given information about stroke prevention and what symptoms should prompt the patient to seek immediate medical care. Thank you for allowing [place] to participate in this patient's care.  Charisse March, RN, MSN, FNP-C [facility] Office: [phone]  Clinic Physician: Darrick Penna  01/08/18 4:13 PM

## 2018-01-08 NOTE — Patient Instructions (Signed)

## 2018-01-09 ENCOUNTER — Other Ambulatory Visit: Payer: Self-pay

## 2018-01-09 ENCOUNTER — Ambulatory Visit (INDEPENDENT_AMBULATORY_CARE_PROVIDER_SITE_OTHER): Payer: MEDICARE | Admitting: Physician Assistant

## 2018-01-09 ENCOUNTER — Encounter: Payer: Self-pay | Admitting: Physician Assistant

## 2018-01-09 VITALS — BP 160/62 | HR 63 | Temp 97.7°F | Resp 16 | Ht 69.0 in | Wt 146.4 lb

## 2018-01-09 DIAGNOSIS — N644 Mastodynia: Secondary | ICD-10-CM

## 2018-01-09 NOTE — Patient Instructions (Signed)
° ° ° °  If you have lab work done today you will be contacted with your lab results within the next 2 weeks.  If you have not heard from us then please contact us. The fastest way to get your results is to register for My Chart. ° ° °IF you received an x-ray today, you will receive an invoice from [facility]. Please contact  Radiology at [phone] with questions or concerns regarding your invoice.  ° °IF you received labwork today, you will receive an invoice from [facility]. Please contact [facility] at [phone] with questions or concerns regarding your invoice.  ° °Our billing staff will not be able to assist you with questions regarding bills from these companies. ° °You will be contacted with the lab results as soon as they are available. The fastest way to get your results is to activate your My Chart account. Instructions are located on the last page of this paperwork. If you have not heard from us regarding the results in 2 weeks, please contact this office. °  ° ° ° °

## 2018-01-09 NOTE — Progress Notes (Signed)
Ashley Berg  MRN: [id number] DOB: [birth year]  PCP: Sherren Mocha, MD  Subjective:  Pt is a 72 year old female who presents to clinic for right breast pain.  Feels like an "ache". "I lifted my breast and had a little red streak". I used alcohol and triamcinolone cream and went away x 2 nights. Today she wanted to discuss what happened and to get a second pair of eyes on her breast.  The red streak is gone and she is no longer feeling any ache.   Review of Systems  Constitutional: Negative for chills and fever.  Cardiovascular: Positive for chest pain (right breast).    Patient Active Problem List   Diagnosis Date Noted  . Hyperparathyroidism (HCC) 02/01/2016  . Carotid arterial disease (HCC) 01/12/2014  . Hyperlipidemia 08/17/2013  . Aftercare following surgery of the circulatory system, NEC 04/20/2013  . Occlusion and stenosis of carotid artery without mention of cerebral infarction 09/22/2012  . HTN (hypertension) 08/23/2010  . Hypothyroidism 08/23/2010    Current Outpatient Medications on File Prior to Visit  Medication Sig Dispense Refill  . aspirin EC 81 MG tablet Take 1 tablet (81 mg total) by mouth daily. 30 tablet 11  . b complex vitamins tablet Take 1 tablet by mouth daily.     . Cholecalciferol (VITAMIN D3 PO) Take 1,000 mg by mouth.    . digoxin (LANOXIN) 0.125 MG tablet TAKE 1 TABLET (0.125 MG TOTAL) DAILY BY MOUTH. 90 tablet 1  . Glucosamine-Chondroitin-MSM (TRIPLE FLEX PO) Take 1 tablet by mouth as directed.    . Homeopathic Products (SIMILASAN DRY EYE RELIEF) SOLN Place 1 drop into both eyes as needed (dry eyes).    [name] levothyroxine (SYNTHROID, LEVOTHROID) 100 MCG tablet TAKE 1 TABLET BY MOUTH DAILY BEFORE BREAKFAST. (Patient taking differently: TAKE 1/4 TABLET BY MOUTH DAILY BEFORE BREAKFAST.) 90 tablet 3  . Magnesium 400 MG CAPS Take 1 capsule by mouth 2 (two) times daily.    . metoprolol succinate (TOPROL-XL) 25 MG 24 hr tablet TAKE 1/2 (ONE HALF) TABLETS (12.5  MG TOTAL) BY MOUTH DAILY. 45 tablet 3  . NON FORMULARY Primal Lab Cardio Relax AO: Take 1 tab by mouth twice a day    . NON FORMULARY Primal MyoCor Balance: Take 1 tab by mouth twice a day    . Olmesartan-amLODIPine-HCTZ 40-10-25 MG TABS Take 1 tablet by mouth daily. 90 tablet 3  . OVER THE COUNTER MEDICATION Take 1 tablet by mouth daily. Natural thyroid support    . OVER THE COUNTER MEDICATION Take 1 capsule by mouth daily. polycosanol natural supplement    . POTASSIUM PO Take by mouth.    . Red Yeast Rice Extract (RED YEAST RICE PO) Take 1 capsule by mouth daily.    [name] triamcinolone (KENALOG) 0.025 % cream Apply 1 application topically 2 (two) times daily. To chest 80 g 0  . cetirizine (ZYRTEC) 10 MG tablet TAKE 1 TABLET BY MOUTH EVERYDAY AT BEDTIME (Patient not taking: Reported on 01/09/2018) 30 tablet 0  . [DISCONTINUED] hydrochlorothiazide (MICROZIDE) 12.5 MG capsule Take 1 capsule (12.5 mg total) by mouth daily. 30 capsule 5   No current facility-administered medications on file prior to visit.     Allergies  Allergen Reactions  . Broccoli [Brassica Oleracea Italica]     GI upset  . Chicken Protein     Does not eat meat  . Milk-Related Compounds     GI upset  . Mushroom Extract Complex  GI upset  . Onion     GI upset  . Penicillins Other (See Comments)    "skin crawls"     Objective:  BP (!) 160/62 (BP Location: Left Arm, Patient Position: Sitting, Cuff Size: Normal)   Pulse 63   Temp 97.7 F (36.5 C) (Oral)   Resp 16   Ht 5\' 9"  (1.753 m)   Wt 146 lb 6.4 oz (66.4 kg)   SpO2 99%   BMI 21.62 kg/m   Physical Exam  Constitutional: She appears well-developed and well-nourished.  Pulmonary/Chest: Right breast exhibits no inverted nipple, no nipple discharge, no skin change and no tenderness. Left breast exhibits no inverted nipple, no nipple discharge, no skin change and no tenderness.  No erythema, warmth, drainage, or lesion under right breast.   Skin: Skin is warm  and dry.  Psychiatric: She has a normal mood and affect. Her behavior is normal. Judgment and thought content normal.  Vitals reviewed.   Assessment and Plan :  1. Breast pain, right - Pt experienced redness and aching of right breast a few days ago and applies alcohol and triamcinolone cream to the area with full resolution of the problem. She is here today just to be sure everything is okay. Suspect possible intertrigo vs cellulitis. No treatment necessary as symptoms have resolved. RTC if recurrence. Spent 20 mins with the patient - greater than 50% of the visit was face to face counseling patient regarding breast health.   Marco Collie, PA-C  Primary Care at [facility] 01/09/2018 2:39 PM  Please note: Portions of this report may have been transcribed using dragon voice recognition software. Every effort was made to ensure accuracy; however, inadvertent computerized transcription errors may be present.

## 2018-01-15 ENCOUNTER — Ambulatory Visit: Payer: MEDICARE | Admitting: Family Medicine

## 2018-01-23 ENCOUNTER — Other Ambulatory Visit: Payer: Self-pay | Admitting: Cardiology

## 2018-02-03 DIAGNOSIS — E7800 Pure hypercholesterolemia, unspecified: Secondary | ICD-10-CM | POA: Diagnosis not present

## 2018-02-03 DIAGNOSIS — I10 Essential (primary) hypertension: Secondary | ICD-10-CM | POA: Diagnosis not present

## 2018-02-03 DIAGNOSIS — E213 Hyperparathyroidism, unspecified: Secondary | ICD-10-CM | POA: Diagnosis not present

## 2018-02-03 DIAGNOSIS — E039 Hypothyroidism, unspecified: Secondary | ICD-10-CM | POA: Diagnosis not present

## 2018-02-03 LAB — BASIC METABOLIC PANEL
BUN / CREAT RATIO: 30 — AB (ref 12–28)
BUN: 30 mg/dL — AB (ref 8–27)
CHLORIDE: 98 mmol/L (ref 96–106)
CO2: 25 mmol/L (ref 20–29)
CREATININE: 0.99 mg/dL (ref 0.57–1.00)
Calcium: 11.3 mg/dL — ABNORMAL HIGH (ref 8.7–10.3)
GFR calc Af Amer: 65 mL/min/{1.73_m2} (ref >59)
GFR calc non Af Amer: 57 mL/min/{1.73_m2} — ABNORMAL LOW (ref >59)
GLUCOSE: 112 mg/dL — AB (ref 65–99)
Potassium: 4.4 mmol/L (ref 3.5–5.2)
SODIUM: 140 mmol/L (ref 134–144)

## 2018-02-03 LAB — T4, FREE: Free T4: 1.28 ng/dL (ref 0.82–1.77)

## 2018-02-03 LAB — TSH: TSH: 4.05 u[IU]/mL (ref 0.450–4.500)

## 2018-02-06 NOTE — Progress Notes (Signed)
Ashley OchsJudy L Berg Date of Birth: 1945/06/13   History of Present Illness: Ashley HongJudy is seen back today for follow up HTN. She has a history of HTN and hyperlipidemia. She is s/p left CEA. She has a history of hypothyroidism.  She remains on Tribenzor and metoprolol for BP.   She does not take her blood pressure at home because it makes her feel too anxious.    On prior visit she was noted to have a chronically elevated calcium level. PTH level was mildly elevated. No history of renal stones. No fractures. Normal renal function. She has not been taking calcium supplements. She did stop her supplement of Vitamin D.   Carotid dopplers in October 2019 showed nonobstructive disease.  On follow up today she is doing well.  Her weight is stable.  She denies any palpitations, SOB, chest pain. Energy level is good. No cramps. Needs prior authorization for her tribenzor.   Current Outpatient Medications on File Prior to Visit  Medication Sig Dispense Refill  . aspirin EC 81 MG tablet Take 1 tablet (81 mg total) by mouth daily. 30 tablet 11  . b complex vitamins tablet Take 1 tablet by mouth daily.     . cetirizine (ZYRTEC) 10 MG tablet TAKE 1 TABLET BY MOUTH EVERYDAY AT BEDTIME 30 tablet 0  . Cholecalciferol (VITAMIN D3 PO) Take 1,000 mg by mouth.    . digoxin (LANOXIN) 0.125 MG tablet TAKE 1 TABLET (0.125 MG TOTAL) DAILY BY MOUTH. 90 tablet 1  . Glucosamine-Chondroitin-MSM (TRIPLE FLEX PO) Take 1 tablet by mouth as directed.    . Homeopathic Products (SIMILASAN DRY EYE RELIEF) SOLN Place 1 drop into both eyes as needed (dry eyes).    [name]. levothyroxine (SYNTHROID, LEVOTHROID) 100 MCG tablet TAKE 1 TABLET BY MOUTH DAILY BEFORE BREAKFAST. (Patient taking differently: TAKE 1/4 TABLET BY MOUTH DAILY BEFORE BREAKFAST.) 90 tablet 3  . Magnesium 400 MG CAPS Take 1 capsule by mouth 2 (two) times daily.    . metoprolol succinate (TOPROL-XL) 25 MG 24 hr tablet TAKE 1/2 (ONE HALF) TABLETS (12.5 MG TOTAL) BY MOUTH  DAILY. 45 tablet 3  . NON FORMULARY Primal Lab Cardio Relax AO: Take 1 tab by mouth twice a day    . NON FORMULARY Primal MyoCor Balance: Take 1 tab by mouth twice a day    . Olmesartan-amLODIPine-HCTZ 40-10-25 MG TABS Take 1 tablet by mouth daily. 90 tablet 3  . OVER THE COUNTER MEDICATION Take 1 tablet by mouth daily. Natural thyroid support    . OVER THE COUNTER MEDICATION Take 1 capsule by mouth daily. polycosanol natural supplement    . POTASSIUM PO Take by mouth.    . Red Yeast Rice Extract (RED YEAST RICE PO) Take 1 capsule by mouth daily.    [name]. triamcinolone (KENALOG) 0.025 % cream Apply 1 application topically 2 (two) times daily. To chest 80 g 0  . [DISCONTINUED] hydrochlorothiazide (MICROZIDE) 12.5 MG capsule Take 1 capsule (12.5 mg total) by mouth daily. 30 capsule 5   No current facility-administered medications on file prior to visit.     Allergies  Allergen Reactions  . Broccoli [Brassica Oleracea Italica]     GI upset  . Chicken Protein     Does not eat meat  . Milk-Related Compounds     GI upset  . Mushroom Extract Complex     GI upset  . Onion     GI upset  . Penicillins Other (See Comments)    "  skin crawls"    Past Medical History:  Diagnosis Date  . Carotid artery occlusion   . Hyperlipidemia 08/17/2013  . Hypertension   . Hypothyroidism    Refuses conventional medicine  . PAF (paroxysmal atrial fibrillation) (HCC)   . Seasonal allergies     Past Surgical History:  Procedure Laterality Date  . APPENDECTOMY    . CAROTID ENDARTERECTOMY    . CERVICAL POLYPECTOMY    . DOPPLER ECHOCARDIOGRAPHY  03/02/1999   NORMAL LEFT VENTRICULAR SIZE. EF 55%  . ENDARTERECTOMY Left 09/24/2012   Procedure: ENDARTERECTOMY CAROTID;  Surgeon: Pryor Ochoa, MD;  Location: [facility] OR;  Service: Vascular;  Laterality: Left;  . EXTERNAL EAR SURGERY Right 1983   tympanoplasty with patch  . PATCH ANGIOPLASTY Left 09/24/2012   Procedure: PATCH ANGIOPLASTY;  Surgeon: Pryor Ochoa, MD;   Location: [facility] OR;  Service: Vascular;  Laterality: Left;  . TUBAL LIGATION      Social History   Tobacco Use  Smoking Status Never Smoker  Smokeless Tobacco Never Used    Social History   Substance and Sexual Activity  Alcohol Use No  . Alcohol/week: 0.0 standard drinks    Family History  Problem Relation Age of Onset  . Stroke Father   . Alzheimer's disease Mother     Review of Systems: As noted in HPI.   All other systems were reviewed and are negative.  Physical Exam: BP (!) 149/78   Pulse 78   Ht 5\' 10"  (1.778 m)   Wt 144 lb 3.2 oz (65.4 kg)   BMI 20.69 kg/m  GENERAL:  Well appearing WF in NAD HEENT:  PERRL, EOMI, sclera are clear. Oropharynx is clear. NECK:  No jugular venous distention, carotid upstroke brisk and symmetric, no bruits, no thyromegaly or adenopathy LUNGS:  Clear to auscultation bilaterally CHEST:  Unremarkable HEART:  RRR,  PMI not displaced or sustained,S1 and S2 within normal limits, no S3, no S4: no clicks, no rubs, no murmurs ABD:  Soft, nontender. BS +, no masses or bruits. No hepatomegaly, no splenomegaly EXT:  2 + pulses throughout, no edema, no cyanosis no clubbing SKIN:  Warm and dry.  No rashes NEURO:  Alert and oriented x 3. Cranial nerves II through XII intact. PSYCH:  Cognitively intact      LABORATORY DATA:    Lab Results  Component Value Date   WBC 5.3 07/28/2017   HGB 14.1 07/28/2017   HCT 42.9 07/28/2017   PLT 208 07/28/2017   GLUCOSE 112 (H) 02/03/2018   CHOL 186 07/28/2017   TRIG 79 07/28/2017   HDL 58 07/28/2017   LDLDIRECT 144.4 09/08/2012   LDLCALC 112 (H) 07/28/2017   ALT 23 07/28/2017   AST 24 07/28/2017   NA 140 02/03/2018   K 4.4 02/03/2018   CL 98 02/03/2018   CREATININE 0.99 02/03/2018   BUN 30 (H) 02/03/2018   CO2 25 02/03/2018   TSH 4.050 02/03/2018   INR 1.00 09/22/2012    Assessment / Plan: 1. Carotid arterial disease s/p left CEA.  Continue ASA. Dopplers in October 2018 showed 40-59%  RICA disease and no restenosis on the left. Refuses to take statin. On red yeast rice.   2. HTN - continue higher dose of Tribenzor  and  Toprol XL 12.5 mg daily. She is tolerating this well.  BP is well controlled today. Will try and get authorization for Tribenzor.   3. Hyperlipidemia. Does not want to take statin. On red yeast rice.  Lipids are fair but not at goal.   4. Paroxysmal AFib- remote history - no symptomatic recurrence. On digoxin. Last level 0.7.   5. Hypothyroidism on synthroid. Levels in normal range. Would keep her on same dose.   6. Hypercalcemia. PTH consistent with mild hyperparathyroidism. Calcium level has increased.  She is asymptomatic. Will need to follow calcium levels closely. If trend is up will need to consider for parathyroidectomy.  I will follow up in 6 months with lab work.

## 2018-02-09 ENCOUNTER — Ambulatory Visit (INDEPENDENT_AMBULATORY_CARE_PROVIDER_SITE_OTHER): Payer: MEDICARE | Admitting: Cardiology

## 2018-02-09 ENCOUNTER — Encounter: Payer: Self-pay | Admitting: Cardiology

## 2018-02-09 VITALS — BP 149/78 | HR 78 | Ht 70.0 in | Wt 144.2 lb

## 2018-02-09 DIAGNOSIS — E039 Hypothyroidism, unspecified: Secondary | ICD-10-CM

## 2018-02-09 DIAGNOSIS — I6522 Occlusion and stenosis of left carotid artery: Secondary | ICD-10-CM | POA: Diagnosis not present

## 2018-02-09 DIAGNOSIS — E7800 Pure hypercholesterolemia, unspecified: Secondary | ICD-10-CM

## 2018-02-09 DIAGNOSIS — I10 Essential (primary) hypertension: Secondary | ICD-10-CM

## 2018-02-09 NOTE — Patient Instructions (Signed)
Continue your current therapy  Follow up in 6 months with lab

## 2018-02-12 ENCOUNTER — Telehealth: Payer: Self-pay

## 2018-02-12 NOTE — Telephone Encounter (Signed)
Tier exception form completed for olmesartan/amlodipine/hctz.Form faxed to [employer] at fax # [phone].

## 2018-02-16 ENCOUNTER — Telehealth: Payer: Self-pay | Admitting: Cardiology

## 2018-02-16 ENCOUNTER — Telehealth: Payer: Self-pay

## 2018-02-16 MED ORDER — OLMESARTAN-AMLODIPINE-HCTZ 40-10-25 MG PO TABS: 1.0000 | ORAL_TABLET | Freq: Every day | ORAL | 3 refills | Status: DC

## 2018-02-16 NOTE — Telephone Encounter (Signed)
Called patient left message on personal voice mail insurance denied tier exception for olmesartan.

## 2018-02-16 NOTE — Telephone Encounter (Signed)
Follow up:    Patient returning call back. If patient do not answer please call at [phone]

## 2018-02-16 NOTE — Telephone Encounter (Signed)
Spoke to patient we received letter from [employer] of approval for olmesartan.Refill sent to pharmacy.

## 2018-06-03 ENCOUNTER — Telehealth: Payer: Self-pay | Admitting: Cardiology

## 2018-06-03 MED ORDER — IRBESARTAN-HYDROCHLOROTHIAZIDE 300-12.5 MG PO TABS: 1.0000 | ORAL_TABLET | Freq: Every day | ORAL | 1 refills | Status: DC

## 2018-06-03 MED ORDER — AMLODIPINE BESYLATE 10 MG PO TABS: 10.0000 mg | ORAL_TABLET | Freq: Every day | ORAL | 0 refills | Status: DC

## 2018-06-03 NOTE — Telephone Encounter (Signed)
  Pt c/o medication issue:  1. Name of Medication: Olmesartan-amLODIPine-HCTZ 40-10-25 MG TABS  2. How are you currently taking this medication (dosage and times per day)? As directed  3. Are you having a reaction (difficulty breathing--STAT)? No  4. What is your medication issue? Patient is needing to speak with the nurse regarding this medication. She states that the price of this medication as went up to $109/month. She would like to discuss other options that are more cost efficient. Patient is almost out and will need a new script soon.

## 2018-06-03 NOTE — Telephone Encounter (Signed)
Okay to send as recommended by pharmacist.  Ashley Berg 300-12.5mg  daily  Amlodipine 10mg  daily  Patient will need to monitor BP 2-3x/week for 4 weeks and call back if dose adjustment needed.

## 2018-06-03 NOTE — Telephone Encounter (Signed)
Called patient, she states that her medication is a now a tier 3 and went from costing $9 to $195, she states that we could do an exception again, or the pharmacist advised that we could switch her.  Since the Olmesartan is the tier 3- they recommended switching to Irbesartan and HCTZ mixed pill, and taking the amlodipine by itself as these are all tier 2 drugs and will not cost her more money.  Please advise?

## 2018-06-03 NOTE — Telephone Encounter (Signed)
Called patient, LVM advising of medication changes, and to check BP for 4 weeks 2-3x a week and to call back if adjustment was needed.

## 2018-06-04 NOTE — Telephone Encounter (Signed)
I agree with recommendation of pharmacist. This is the same medication she has been taking but just different formulation due to insurance coverage.

## 2018-06-04 NOTE — Telephone Encounter (Signed)
Spoke with pt, she got the information recommended by the pharmacist. She would like to know if dr [place] agrees with the recommendations. She is not going to take her bp because it causes anxiety and therefore her bp will be high. She does not really want to change medications and wonders if there are other options. She is going to check about changing insurance plans. She wants dr Elvis Coil input. Will forward to dr [place] to review and advise.

## 2018-06-04 NOTE — Telephone Encounter (Signed)
Spoke with pt, aware of dr jordan's recommendations. 

## 2018-06-04 NOTE — Telephone Encounter (Signed)
Patient called today, she has a few more questions she would like to ask.

## 2018-06-05 ENCOUNTER — Other Ambulatory Visit: Payer: Self-pay

## 2018-06-05 ENCOUNTER — Ambulatory Visit (INDEPENDENT_AMBULATORY_CARE_PROVIDER_SITE_OTHER): Payer: MEDICARE | Admitting: Family Medicine

## 2018-06-05 ENCOUNTER — Telehealth: Payer: Self-pay | Admitting: Cardiology

## 2018-06-05 ENCOUNTER — Encounter: Payer: Self-pay | Admitting: Family Medicine

## 2018-06-05 VITALS — BP 124/62 | HR 73 | Temp 97.6°F | Resp 18 | Ht 70.0 in | Wt 148.6 lb

## 2018-06-05 DIAGNOSIS — Z23 Encounter for immunization: Secondary | ICD-10-CM

## 2018-06-05 DIAGNOSIS — I10 Essential (primary) hypertension: Secondary | ICD-10-CM

## 2018-06-05 DIAGNOSIS — Z1159 Encounter for screening for other viral diseases: Secondary | ICD-10-CM

## 2018-06-05 DIAGNOSIS — I781 Nevus, non-neoplastic: Secondary | ICD-10-CM | POA: Diagnosis not present

## 2018-06-05 NOTE — Telephone Encounter (Signed)
  Pt c/o medication issue:  1. Name of Medication: irbesartan-hydrochlorothiazide (AVALIDE) 300-12.5 MG tablet  2. How are you currently taking this medication (dosage and times per day)? As directed  3. Are you having a reaction (difficulty breathing--STAT)?  No  4. What is your medication issue? Patient would like to know why her medication was changed from 25 mg to the 12.5 mg

## 2018-06-05 NOTE — Telephone Encounter (Signed)
Pt calling with inquiry on med change.  She states irbesartan-hydrochlorothiazide (AVALIDE) 300-12.5 MG tablet only contains 12.5 mg of HCTZ and she is concerned that this will not be a sufficient enough amount of the diuretic since her previous medication, olmesartan-amLODIPine-HCTZ 40-10-25 MG, contained 25 mg of HCTZ.  She also states that during her OV with her PCP today, her PCP told her that Avalide may not come in a combination with 25 mg of HCTZ. She states her PCP suggested that she talk to Dr. Sw[place]and about splitting irbesartan and HCTZ into two separate meds (if no combination available of Avalide that has 25 mg of HCTZ) so that she can take 25 mg of HCTZ.  She states that she will finish what she has left of her olmesartan-amLODIPine-HCTZ 40-10-25 MG Rx and wants Dr. [place] to address the decrease in her diuretic and give the okay for 25 mg of HCTZ before she starts taking irbesartan-hydrochlorothiazide (AVALIDE).   Will route to Dr. [place] and his nurse for review.

## 2018-06-05 NOTE — Progress Notes (Signed)
Established Patient Office Visit  Subjective:  Patient ID: Ashley Berg, female    DOB: [birth year]  Age: 73 y.o. MRN: [id number]  CC:  Chief Complaint  Patient presents with  . Rash    face and left arm face started 2 months ago and arm begining of year   . Medication Refill    needs to discuss meds     HPI Ashley Berg presents for  She states that she has a rash that started on the right cheek that has been there for 3 months She is taking silverbiotics, fluorasone cream and triamcinolone 0.02% cream  The rash started on back of the hands, face  She has a history of eczema rash on the chest She has been noticing that the facial rash is elongating No vesicles There is some itching and blistering  She has a history of shingles  She has numerous food allergies and cannot consume meat, dairy or broccoli  Paroxysmal A. Fib and Hypertension  Patient reports that her bp medications were changed  She is confused as to why her medications were changed She is on amlodipine 10mg , irbesartan-hctz 300-12.5mg , she is also on digoxin 0.125mg  She avoid stimulants and has a low sugar diet She denies chest pains or palpitations  BP Readings from Last 3 Encounters:  06/05/18 124/62  02/09/18 (!) 149/78  01/09/18 (!) 160/62    Past Medical History:  Diagnosis Date  . Carotid artery occlusion   . Hyperlipidemia 08/17/2013  . Hypertension   . Hypothyroidism    Refuses conventional medicine  . PAF (paroxysmal atrial fibrillation) (HCC)   . Seasonal allergies     Past Surgical History:  Procedure Laterality Date  . APPENDECTOMY    . CAROTID ENDARTERECTOMY    . CERVICAL POLYPECTOMY    . DOPPLER ECHOCARDIOGRAPHY  03/02/1999   NORMAL LEFT VENTRICULAR SIZE. EF 55%  . ENDARTERECTOMY Left 09/24/2012   Procedure: ENDARTERECTOMY CAROTID;  Surgeon: Pryor Ochoa, MD;  Location: [facility] OR;  Service: Vascular;  Laterality: Left;  . EXTERNAL EAR SURGERY Right 1983   tympanoplasty with patch   . PATCH ANGIOPLASTY Left 09/24/2012   Procedure: PATCH ANGIOPLASTY;  Surgeon: Pryor Ochoa, MD;  Location: [facility] OR;  Service: Vascular;  Laterality: Left;  . TUBAL LIGATION      Family History  Problem Relation Age of Onset  . Stroke Father   . Alzheimer's disease Mother     Social History   Socioeconomic History  . Marital status: Widowed    Spouse name: Not on file  . Number of children: Not on file  . Years of education: Not on file  . Highest education level: Not on file  Occupational History  . Not on file  Social Needs  . Financial resource strain: Not on file  . Food insecurity:    Worry: Not on file    Inability: Not on file  . Transportation needs:    Medical: Not on file    Non-medical: Not on file  Tobacco Use  . Smoking status: Never Smoker  . Smokeless tobacco: Never Used  Substance and Sexual Activity  . Alcohol use: No    Alcohol/week: 0.0 standard drinks  . Drug use: No  . Sexual activity: Not on file  Lifestyle  . Physical activity:    Days per week: Not on file    Minutes per session: Not on file  . Stress: Not on file  Relationships  . Social connections:  Talks on phone: Not on file    Gets together: Not on file    Attends religious service: Not on file    Active member of club or organization: Not on file    Attends meetings of clubs or organizations: Not on file    Relationship status: Not on file  . Intimate partner violence:    Fear of current or ex partner: Not on file    Emotionally abused: Not on file    Physically abused: Not on file    Forced sexual activity: Not on file  Other Topics Concern  . Not on file  Social History Narrative  . Not on file    Outpatient Medications Prior to Visit  Medication Sig Dispense Refill  . amLODipine (NORVASC) 10 MG tablet Take 1 tablet (10 mg total) by mouth daily. 180 tablet 0  . aspirin EC 81 MG tablet Take 1 tablet (81 mg total) by mouth daily. 30 tablet 11  . b complex vitamins tablet  Take 1 tablet by mouth daily.     . cetirizine (ZYRTEC) 10 MG tablet TAKE 1 TABLET BY MOUTH EVERYDAY AT BEDTIME 30 tablet 0  . Cholecalciferol (VITAMIN D3 PO) Take 1,000 mg by mouth.    . digoxin (LANOXIN) 0.125 MG tablet TAKE 1 TABLET (0.125 MG TOTAL) DAILY BY MOUTH. 90 tablet 1  . Glucosamine-Chondroitin-MSM (TRIPLE FLEX PO) Take 1 tablet by mouth as directed.    . Homeopathic Products (SIMILASAN DRY EYE RELIEF) SOLN Place 1 drop into both eyes as needed (dry eyes).    . irbesartan-hydrochlorothiazide (AVALIDE) 300-12.5 MG tablet Take 1 tablet by mouth daily. 90 tablet 1  . levothyroxine (SYNTHROID, LEVOTHROID) 100 MCG tablet TAKE 1 TABLET BY MOUTH DAILY BEFORE BREAKFAST. (Patient taking differently: TAKE 1/4 TABLET BY MOUTH DAILY BEFORE BREAKFAST.) 90 tablet 3  . Magnesium 400 MG CAPS Take 1 capsule by mouth 2 (two) times daily.    . metoprolol succinate (TOPROL-XL) 25 MG 24 hr tablet TAKE 1/2 (ONE HALF) TABLETS (12.5 MG TOTAL) BY MOUTH DAILY. 45 tablet 3  . NON FORMULARY Primal Lab Cardio Relax AO: Take 1 tab by mouth twice a day    . NON FORMULARY Primal MyoCor Balance: Take 1 tab by mouth twice a day    . OVER THE COUNTER MEDICATION Take 1 tablet by mouth daily. Natural thyroid support    . OVER THE COUNTER MEDICATION Take 1 capsule by mouth daily. polycosanol natural supplement    . POTASSIUM PO Take by mouth.    . Red Yeast Rice Extract (RED YEAST RICE PO) Take 1 capsule by mouth daily.    [name] triamcinolone (KENALOG) 0.025 % cream Apply 1 application topically 2 (two) times daily. To chest 80 g 0   No facility-administered medications prior to visit.     Allergies  Allergen Reactions  . Broccoli [Brassica Oleracea Italica]     GI upset  . Chicken Protein     Does not eat meat  . Milk-Related Compounds     GI upset  . Mushroom Extract Complex     GI upset  . Onion     GI upset  . Penicillins Other (See Comments)    "skin crawls"    ROS Review of Systems Review of Systems   Constitutional: Negative for activity change, appetite change, chills and fever.  HENT: Negative for congestion, nosebleeds, trouble swallowing and voice change.   Respiratory: Negative for cough, shortness of breath and wheezing.   Gastrointestinal:  Negative for diarrhea, nausea and vomiting.  Genitourinary: Negative for difficulty urinating, dysuria, flank pain and hematuria.  Musculoskeletal: Negative for back pain, joint swelling and neck pain.  Neurological: Negative for dizziness, speech difficulty, light-headedness and numbness.  See HPI. All other review of systems negative.     Objective:    Physical Exam  BP 124/62   Pulse 73   Temp 97.6 F (36.4 C) (Oral)   Resp 18   Ht 5\' 10"  (1.778 m)   Wt 148 lb 9.6 oz (67.4 kg)   SpO2 99%   BMI 21.32 kg/m  Wt Readings from Last 3 Encounters:  06/05/18 148 lb 9.6 oz (67.4 kg)  02/09/18 144 lb 3.2 oz (65.4 kg)  01/09/18 146 lb 6.4 oz (66.4 kg)    Physical Exam  Constitutional: Oriented to person, place, and time. Appears well-developed and well-nourished.  HENT:  Head: Normocephalic and atraumatic.  Eyes: Conjunctivae and EOM are normal.  Cardiovascular: sinus rhythm Pulmonary/Chest: Effort normal and breath sounds normal. No stridor. No respiratory distress. Has no wheezes.  Neurological: Is alert and oriented to person, place, and time.  Psychiatric: Has a normal mood and affect. Behavior is normal. Judgment and thought content normal.   Skin with red lesion that blanches, flat, noted to be there on thin skin No vesicles, no papules  Health Maintenance Due  Topic Date Due  . Hepatitis C Screening  April 23, 1945  . MAMMOGRAM  12/10/1994  . COLONOSCOPY  12/10/1994  . DEXA SCAN  12/09/2009  . PNA vac Low Risk Adult (1 of 2 - PCV13) 12/09/2009    There are no preventive care reminders to display for this patient.  Lab Results  Component Value Date   TSH 4.050 02/03/2018   Lab Results  Component Value Date   WBC 5.3  07/28/2017   HGB 14.1 07/28/2017   HCT 42.9 07/28/2017   MCV 99 (H) 07/28/2017   PLT 208 07/28/2017   Lab Results  Component Value Date   NA 140 02/03/2018   K 4.4 02/03/2018   CO2 25 02/03/2018   GLUCOSE 112 (H) 02/03/2018   BUN 30 (H) 02/03/2018   CREATININE 0.99 02/03/2018   BILITOT 0.5 07/28/2017   ALKPHOS 92 07/28/2017   AST 24 07/28/2017   ALT 23 07/28/2017   PROT 7.2 07/28/2017   ALBUMIN 5.1 (H) 07/28/2017   CALCIUM 11.3 (H) 02/03/2018   GFR 74.58 08/20/2013   Lab Results  Component Value Date   CHOL 186 07/28/2017   Lab Results  Component Value Date   HDL 58 07/28/2017   Lab Results  Component Value Date   LDLCALC 112 (H) 07/28/2017   Lab Results  Component Value Date   TRIG 79 07/28/2017   Lab Results  Component Value Date   CHOLHDL 2.9 08/02/2016   No results found for: HGBA1C    Assessment & Plan:   Problem List Items Addressed This Visit      Cardiovascular and Mediastinum   HTN (hypertension)  - reviewed bp meds  Continue with Cardiology recs     Other Visit Diagnoses    Need for hepatitis C screening test    -  Primary   Relevant Orders   HCV Ab w/Rflx to Verification   Telangiectasia of face    -  Discussed that this could be a part of a syndrome and would recommend evaluation by Dermatology At this point the areas are enlarging    Relevant Orders   Ambulatory referral  to Dermatology      No orders of the defined types were placed in this encounter.   Follow-up: No follow-ups on file.    Doristine Bosworth, MD

## 2018-06-05 NOTE — Patient Instructions (Signed)
° ° ° °  If you have lab work done today you will be contacted with your lab results within the next 2 weeks.  If you have not heard from us then please contact us. The fastest way to get your results is to register for My Chart. ° ° °IF you received an x-ray today, you will receive an invoice from [facility]. Please contact [facility] at [phone] with questions or concerns regarding your invoice.  ° °IF you received labwork today, you will receive an invoice from [facility]. Please contact [facility] at [phone] with questions or concerns regarding your invoice.  ° °Our billing staff will not be able to assist you with questions regarding bills from these companies. ° °You will be contacted with the lab results as soon as they are available. The fastest way to get your results is to activate your My Chart account. Instructions are located on the last page of this paperwork. If you have not heard from us regarding the results in 2 weeks, please contact this office. °  ° ° ° °

## 2018-06-06 NOTE — Telephone Encounter (Signed)
Yes we can split the Avalide to irbesartan 300 mg and HCTZ 25 mg daily   Peter Swaziland MD, [facility]

## 2018-06-08 MED ORDER — IRBESARTAN 300 MG PO TABS: 300.0000 mg | ORAL_TABLET | Freq: Every day | ORAL | 3 refills | Status: DC

## 2018-06-08 MED ORDER — HYDROCHLOROTHIAZIDE 25 MG PO TABS: 25.0000 mg | ORAL_TABLET | Freq: Every day | ORAL | 3 refills | Status: DC

## 2018-06-08 NOTE — Telephone Encounter (Signed)
Spoke to patient Dr.Jordan's advice given. 

## 2018-06-08 NOTE — Telephone Encounter (Signed)
° ° °  Patient requesting a call from [place] only to discuss medications

## 2018-06-24 ENCOUNTER — Other Ambulatory Visit: Payer: Self-pay

## 2018-06-24 ENCOUNTER — Other Ambulatory Visit: Payer: Self-pay | Admitting: Cardiology

## 2018-06-24 MED ORDER — DIGOXIN 125 MCG PO TABS: 0.1250 mg | ORAL_TABLET | Freq: Every day | ORAL | 3 refills | Status: DC

## 2018-06-24 NOTE — Telephone Encounter (Signed)
°*  STAT* If patient is at the pharmacy, call can be transferred to refill team.   1. Which medications need to be refilled? (please list name of each medication and dose if known) new perscription for Digoxin-need this asap please  2. Which pharmacy/location (including street and city if local pharmacy) is medication to be sent to? [facility] [phone]  3. Do they need a 30 day or 90 day supply? 90 and refill

## 2018-07-31 ENCOUNTER — Telehealth: Payer: Self-pay | Admitting: Cardiology

## 2018-07-31 NOTE — Telephone Encounter (Signed)
I called patient, she states that she would just like to know what she should do about her blood work, if she could keep the appointment to have it drawn- or wait until appointment is rescheduled. I advised I was unsure of when they were rescheduling those appointments, and advised Ashley Berg was out today- but would be in contact with her on Monday.  Patient had no other questions and was thankful for the call.

## 2018-07-31 NOTE — Telephone Encounter (Signed)
New Message     Pt would like to talk more about her appt with Elnita Maxwell.    Please call back

## 2018-08-05 NOTE — Telephone Encounter (Signed)
Follow up    Patient is calling back in reference to her request about labs. She is wanting to speak with Elnita Maxwell

## 2018-08-07 NOTE — Telephone Encounter (Signed)
Spoke to patient she stated she is doing good.Stated she wants to wait until Dr.Jordan starts seeing patients back in office.Stated she will have lab done 3 to 5 days before the appointment.She does not want to see a PA.Advised I will put her in recalls for Oct.Stated she wants a late pm appt.Advised to call sooner if needed.

## 2018-08-10 ENCOUNTER — Ambulatory Visit: Payer: MEDICARE | Admitting: Cardiology

## 2018-12-02 ENCOUNTER — Other Ambulatory Visit: Payer: Self-pay | Admitting: Cardiology

## 2018-12-02 MED ORDER — AMLODIPINE BESYLATE 10 MG PO TABS: 10.0000 mg | ORAL_TABLET | Freq: Every day | ORAL | 0 refills | Status: DC

## 2018-12-02 NOTE — Telephone Encounter (Signed)
New Message    *STAT* If patient is at the pharmacy, call can be transferred to refill team.   1. Which medications need to be refilled? (please list name of each medication and dose if known) amLODipine (NORVASC) 10 MG tablet    2. Which pharmacy/location (including street and city if local pharmacy) is medication to be sent to[place] [id number] [place] [address]  3. Do they need a 30 day or 90 day supply? [place]

## 2018-12-02 NOTE — Telephone Encounter (Signed)
Requested Prescriptions   Signed Prescriptions Disp Refills  . amLODipine (NORVASC) 10 MG tablet 90 tablet 0    Sig: Take 1 tablet (10 mg total) by mouth daily.    Authorizing Provider: Martinique, PETER M    Ordering User: Raelene Bott, BRANDY L

## 2018-12-16 ENCOUNTER — Other Ambulatory Visit: Payer: Self-pay | Admitting: Cardiology

## 2019-01-04 ENCOUNTER — Ambulatory Visit: Payer: MEDICARE | Admitting: Cardiology

## 2019-01-05 DIAGNOSIS — E7800 Pure hypercholesterolemia, unspecified: Secondary | ICD-10-CM | POA: Diagnosis not present

## 2019-01-05 DIAGNOSIS — E039 Hypothyroidism, unspecified: Secondary | ICD-10-CM | POA: Diagnosis not present

## 2019-01-05 DIAGNOSIS — I10 Essential (primary) hypertension: Secondary | ICD-10-CM | POA: Diagnosis not present

## 2019-01-06 LAB — TSH: TSH: 4.65 u[IU]/mL — ABNORMAL HIGH (ref 0.450–4.500)

## 2019-01-06 LAB — BASIC METABOLIC PANEL
BUN/Creatinine Ratio: 26 (ref 12–28)
BUN: 23 mg/dL (ref 8–27)
CO2: 25 mmol/L (ref 20–29)
Calcium: 10.7 mg/dL — ABNORMAL HIGH (ref 8.7–10.3)
Chloride: 101 mmol/L (ref 96–106)
Creatinine, Ser: 0.88 mg/dL (ref 0.57–1.00)
GFR calc Af Amer: 75 mL/min/{1.73_m2} (ref >59)
GFR calc non Af Amer: 65 mL/min/{1.73_m2} (ref >59)
Glucose: 101 mg/dL — ABNORMAL HIGH (ref 65–99)
Potassium: 4.1 mmol/L (ref 3.5–5.2)
Sodium: 142 mmol/L (ref 134–144)

## 2019-01-06 LAB — T4, FREE: Free T4: 1.16 ng/dL (ref 0.82–1.77)

## 2019-01-08 NOTE — Progress Notes (Addendum)
Ashley Berg Date of Birth: April 21, 1945   History of Present Illness: Ashley Berg is seen back today for follow up HTN. She has a history of HTN and hyperlipidemia. She is s/p left CEA. She has a history of hypothyroidism.  She remains on Tribenzor and metoprolol for BP.   She does not take her blood pressure at home because it makes her feel too anxious.    On prior visit she was noted to have a chronically elevated calcium level. PTH level was mildly elevated. No history of renal stones. No fractures. Normal renal function. She has not been taking calcium supplements. She did stop her supplement of Vitamin D.   Carotid dopplers in October 2019 showed nonobstructive disease.  On follow up today she is miserable. 2 days ago she developed blisters on her right face, lips and tongue. Severe pain in right ear. She has had shingles in similar location in remote past.    She denies any palpitations, SOB, chest pain.   Current Outpatient Medications on File Prior to Visit  Medication Sig Dispense Refill  . amLODipine (NORVASC) 10 MG tablet Take 1 tablet (10 mg total) by mouth daily. 90 tablet 0  . aspirin EC 81 MG tablet Take 1 tablet (81 mg total) by mouth daily. 30 tablet 11  . b complex vitamins tablet Take 1 tablet by mouth daily.     . cetirizine (ZYRTEC) 10 MG tablet TAKE 1 TABLET BY MOUTH EVERYDAY AT BEDTIME 30 tablet 0  . Cholecalciferol (VITAMIN D3 PO) Take 1,000 mg by mouth.    . digoxin (LANOXIN) 0.125 MG tablet Take 1 tablet (0.125 mg total) by mouth daily. 90 tablet 3  . Glucosamine-Chondroitin-MSM (TRIPLE FLEX PO) Take 1 tablet by mouth as directed.    . Homeopathic Products (SIMILASAN DRY EYE RELIEF) SOLN Place 1 drop into both eyes as needed (dry eyes).    . irbesartan (AVAPRO) 300 MG tablet Take 1 tablet (300 mg total) by mouth daily. 90 tablet 3  . levothyroxine (SYNTHROID, LEVOTHROID) 100 MCG tablet TAKE 1 TABLET BY MOUTH DAILY BEFORE BREAKFAST. (Patient taking differently: TAKE  1/4 TABLET BY MOUTH DAILY BEFORE BREAKFAST.) 90 tablet 3  . Magnesium 400 MG CAPS Take 1 capsule by mouth 2 (two) times daily.    . metoprolol succinate (TOPROL-XL) 25 MG 24 hr tablet TAKE 1/2 (ONE HALF) TABLETS (12.5 MG TOTAL) BY MOUTH DAILY. 45 tablet 3  . NON FORMULARY Primal Lab Cardio Relax AO: Take 1 tab by mouth twice a day    . NON FORMULARY Primal MyoCor Balance: Take 1 tab by mouth twice a day    . OVER THE COUNTER MEDICATION Take 1 tablet by mouth daily. Natural thyroid support    . OVER THE COUNTER MEDICATION Take 1 capsule by mouth daily. polycosanol natural supplement    . POTASSIUM PO Take by mouth.    . Red Yeast Rice Extract (RED YEAST RICE PO) Take 1 capsule by mouth daily.    [name] triamcinolone (KENALOG) 0.025 % cream Apply 1 application topically 2 (two) times daily. To chest 80 g 0  . hydrochlorothiazide (HYDRODIURIL) 25 MG tablet Take 1 tablet (25 mg total) by mouth daily. 90 tablet 3   No current facility-administered medications on file prior to visit.     Allergies  Allergen Reactions  . Broccoli [Brassica Oleracea Italica]     GI upset  . Chicken Protein     Does not eat meat  . Milk-Related Compounds  GI upset  . Mushroom Extract Complex     GI upset  . Onion     GI upset  . Penicillins Other (See Comments)    "skin crawls"    Past Medical History:  Diagnosis Date  . Carotid artery occlusion   . Hyperlipidemia 08/17/2013  . Hypertension   . Hypothyroidism    Refuses conventional medicine  . PAF (paroxysmal atrial fibrillation) (HCC)   . Seasonal allergies     Past Surgical History:  Procedure Laterality Date  . APPENDECTOMY    . CAROTID ENDARTERECTOMY    . CERVICAL POLYPECTOMY    . DOPPLER ECHOCARDIOGRAPHY  03/02/1999   NORMAL LEFT VENTRICULAR SIZE. EF 55%  . ENDARTERECTOMY Left 09/24/2012   Procedure: ENDARTERECTOMY CAROTID;  Surgeon: Pryor Ochoa, MD;  Location: [facility] OR;  Service: Vascular;  Laterality: Left;  . EXTERNAL EAR SURGERY Right  1983   tympanoplasty with patch  . PATCH ANGIOPLASTY Left 09/24/2012   Procedure: PATCH ANGIOPLASTY;  Surgeon: Pryor Ochoa, MD;  Location: [facility] OR;  Service: Vascular;  Laterality: Left;  . TUBAL LIGATION      Social History   Tobacco Use  Smoking Status Never Smoker  Smokeless Tobacco Never Used    Social History   Substance and Sexual Activity  Alcohol Use No  . Alcohol/week: 0.0 standard drinks    Family History  Problem Relation Age of Onset  . Stroke Father   . Alzheimer's disease Mother     Review of Systems: As noted in HPI.   All other systems were reviewed and are negative.  Physical Exam: BP (!) 160/72 (BP Location: Left Arm, Patient Position: Sitting, Cuff Size: Normal)   Pulse 86   Temp (!) 97.2 F (36.2 C)   Ht 5\' 10"  (1.778 m)   Wt 142 lb (64.4 kg)   BMI 20.37 kg/m  GENERAL:  Well appearing WF in NAD HEENT:  She has blisters involving her right tongue, lip, cheek and severe blistering in right ear.  NECK:  No jugular venous distention, carotid upstroke brisk and symmetric, no bruits, no thyromegaly or adenopathy LUNGS:  Clear to auscultation bilaterally CHEST:  Unremarkable HEART:  RRR,  PMI not displaced or sustained,S1 and S2 within normal limits, no S3, no S4: no clicks, no rubs, no murmurs ABD:  Soft, nontender. BS +, no masses or bruits. No hepatomegaly, no splenomegaly EXT:  2 + pulses throughout, no edema, no cyanosis no clubbing SKIN:  Warm and dry.  No rashes NEURO:  Alert and oriented x 3. Cranial nerves II through XII intact. PSYCH:  Cognitively intact      LABORATORY DATA:    Lab Results  Component Value Date   WBC 5.3 07/28/2017   HGB 14.1 07/28/2017   HCT 42.9 07/28/2017   PLT 208 07/28/2017   GLUCOSE 101 (H) 01/05/2019   CHOL 186 07/28/2017   TRIG 79 07/28/2017   HDL 58 07/28/2017   LDLDIRECT 144.4 09/08/2012   LDLCALC 112 (H) 07/28/2017   ALT 23 07/28/2017   AST 24 07/28/2017   NA 142 01/05/2019   K 4.1 01/05/2019    CL 101 01/05/2019   CREATININE 0.88 01/05/2019   BUN 23 01/05/2019   CO2 25 01/05/2019   TSH 4.650 (H) 01/05/2019   INR 1.00 09/22/2012   Ecg today shows NSR rate 86. LAD, Incomplete RBBB. Nonspecific ST abnormality. I have personally reviewed and interpreted this study.   Assessment / Plan: 1. Carotid arterial disease s/p left  CEA.  Continue ASA. Dopplers in October 2018 showed 40-59% RICA disease and no restenosis on the left. Refuses to take statin. On red yeast rice.   2. HTN - controlled. Continue higher dose of Tribenzor  and  Toprol XL 12.5 mg daily.   3. Hyperlipidemia. Does not want to take statin. On red yeast rice. Lipids are fair but not at goal.   4. Paroxysmal AFib- remote history - no symptomatic recurrence. On digoxin. Last level 0.7.   5. Hypothyroidism on synthroid. Recent levels satisfactory.  6. Hypercalcemia. PTH consistent with mild hyperparathyroidism. Calcium level improved on recent exam 10.7.  7. Herpes zoster infection involving right ear canal, face, lips and tongue. Will initiate antiviral therapy with varacyclovir 1000 mg tid. Also given gabapentin 100 mg to take 100- 300 mg tid prn pain.   I will follow up in 6 months

## 2019-01-14 ENCOUNTER — Encounter: Payer: Self-pay | Admitting: Cardiology

## 2019-01-14 ENCOUNTER — Other Ambulatory Visit: Payer: Self-pay

## 2019-01-14 ENCOUNTER — Ambulatory Visit (INDEPENDENT_AMBULATORY_CARE_PROVIDER_SITE_OTHER): Payer: MEDICARE | Admitting: Cardiology

## 2019-01-14 VITALS — BP 160/72 | HR 86 | Temp 97.2°F | Ht 70.0 in | Wt 142.0 lb

## 2019-01-14 DIAGNOSIS — E7800 Pure hypercholesterolemia, unspecified: Secondary | ICD-10-CM | POA: Diagnosis not present

## 2019-01-14 DIAGNOSIS — B028 Zoster with other complications: Secondary | ICD-10-CM

## 2019-01-14 DIAGNOSIS — I10 Essential (primary) hypertension: Secondary | ICD-10-CM | POA: Diagnosis not present

## 2019-01-14 DIAGNOSIS — E213 Hyperparathyroidism, unspecified: Secondary | ICD-10-CM

## 2019-01-14 DIAGNOSIS — I6522 Occlusion and stenosis of left carotid artery: Secondary | ICD-10-CM | POA: Diagnosis not present

## 2019-01-14 MED ORDER — GABAPENTIN 100 MG PO CAPS: 100.0000 mg | ORAL_CAPSULE | Freq: Three times a day (TID) | ORAL | 0 refills | Status: DC

## 2019-01-14 MED ORDER — VALACYCLOVIR HCL 1 G PO TABS: 1000.0000 mg | ORAL_TABLET | Freq: Three times a day (TID) | ORAL | 0 refills | Status: DC

## 2019-01-14 NOTE — Patient Instructions (Signed)
Take valacyclovir 1000 mg three times a day  Take gabapentin 100-300 mg three times a day prn neuropathic pain.

## 2019-01-20 NOTE — Telephone Encounter (Signed)
We should try and get a tier exception for her digoxin  Lacora Folmer Martinique MD, [facility]

## 2019-01-26 ENCOUNTER — Telehealth: Payer: Self-pay | Admitting: Cardiology

## 2019-01-26 ENCOUNTER — Other Ambulatory Visit: Payer: Self-pay | Admitting: Cardiology

## 2019-01-26 MED ORDER — AMLODIPINE BESYLATE 10 MG PO TABS: 10.0000 mg | ORAL_TABLET | Freq: Every day | ORAL | 3 refills | Status: DC

## 2019-01-26 MED ORDER — METOPROLOL SUCCINATE ER 25 MG PO TB24: ORAL_TABLET | ORAL | 3 refills | Status: DC

## 2019-01-26 MED ORDER — IRBESARTAN 300 MG PO TABS: 300.0000 mg | ORAL_TABLET | Freq: Every day | ORAL | 3 refills | Status: DC

## 2019-01-26 MED ORDER — GABAPENTIN 100 MG PO CAPS: 100.0000 mg | ORAL_CAPSULE | Freq: Three times a day (TID) | ORAL | 3 refills | Status: DC

## 2019-01-26 MED ORDER — HYDROCHLOROTHIAZIDE 25 MG PO TABS: 25.0000 mg | ORAL_TABLET | Freq: Every day | ORAL | 3 refills | Status: DC

## 2019-01-26 NOTE — Telephone Encounter (Signed)
Returned call to patient she stated she needs a refill on Gabapentin.She also needs refills on other blood pressure medications.Advised I will send refills to pharmacy.

## 2019-01-26 NOTE — Telephone Encounter (Signed)
Patient wanted Malachy Mood to know that the patient's request for gabapentin was denied. She also wanted to know if the Digoxin was going to become a  Tier 1 medicine for 2021

## 2019-01-28 NOTE — Telephone Encounter (Signed)
[employer] called # [phone] Digoxin approved for tier 1 for 1 year.Reference case # [id number].Called patient left message on personal voice mail Digoxin approved for tier 1.

## 2019-04-11 ENCOUNTER — Other Ambulatory Visit: Payer: Self-pay | Admitting: Cardiology

## 2019-04-20 ENCOUNTER — Encounter: Payer: Self-pay | Admitting: *Deleted

## 2019-06-05 ENCOUNTER — Telehealth: Payer: Self-pay | Admitting: Registered Nurse

## 2019-06-05 DIAGNOSIS — I10 Essential (primary) hypertension: Secondary | ICD-10-CM | POA: Diagnosis not present

## 2019-06-05 DIAGNOSIS — H5789 Other specified disorders of eye and adnexa: Secondary | ICD-10-CM | POA: Diagnosis not present

## 2019-06-05 NOTE — Telephone Encounter (Signed)
Pt called answering service with scratch on eye - was triaged to go to ED. Answering service states pt demonstrated understanding and was in agreement with plan Pt will follow up with our office PRN  Jari Sportsman, NP

## 2019-07-02 ENCOUNTER — Ambulatory Visit: Payer: MEDICARE | Attending: Internal Medicine

## 2019-07-02 DIAGNOSIS — Z23 Encounter for immunization: Secondary | ICD-10-CM

## 2019-07-02 NOTE — Progress Notes (Signed)
   Covid-19 Vaccination Clinic  Name:  Ashley Berg    MRN: [id number] DOB: 05/09/1945  07/02/2019  [name] was observed post Covid-19 immunization for 15 minutes without incident. She was provided with Vaccine Information Sheet and instruction to access the V-Safe system.   [name] was instructed to call 911 with any severe reactions post vaccine: [name] Difficulty breathing  . Swelling of face and throat  . A fast heartbeat  . A bad rash all over body  . Dizziness and weakness   Immunizations Administered    Name Date Dose VIS Date Route   Pfizer COVID-19 Vaccine 07/02/2019  2:54 PM 0.3 mL 03/05/2019 Intramuscular   Manufacturer: [employer], [employer]   Lot: [id number]   NDC: [address]-2

## 2019-07-15 NOTE — Progress Notes (Signed)
Ashley Berg Date of Birth: 1945/10/23   History of Present Illness: Ashley Berg is seen back today for follow up HTN. She has a history of HTN and hyperlipidemia. She is s/p left CEA. She has a history of hypothyroidism.  She remains on Tribenzor and metoprolol for BP.   She does not take her blood pressure at home because it makes her feel too anxious.    On prior visit she was noted to have a chronically elevated calcium level. PTH level was mildly elevated. No history of renal stones. No fractures. Normal renal function. She has not been taking calcium supplements. She did stop her supplement of Vitamin D.   Carotid dopplers in October 2019 showed nonobstructive disease.  On follow up today she is doing very well. She has recovered from her zoster infection.   She denies any palpitations, SOB, chest pain. No edema. Has indigestion at times and may note some palpitations with this. Eats a very healthy diet.   Current Outpatient Medications on File Prior to Visit  Medication Sig Dispense Refill  . aspirin EC 81 MG tablet Take 1 tablet (81 mg total) by mouth daily. 30 tablet 11  . b complex vitamins tablet Take 1 tablet by mouth daily.     . cetirizine (ZYRTEC) 10 MG tablet TAKE 1 TABLET BY MOUTH EVERYDAY AT BEDTIME 30 tablet 0  . Cholecalciferol (VITAMIN D3 PO) Take 1,000 mg by mouth.    . digoxin (LANOXIN) 0.125 MG tablet TAKE 1 TABLET BY MOUTH DAILY 90 tablet 2  . Glucosamine-Chondroitin-MSM (TRIPLE FLEX PO) Take 1 tablet by mouth as directed.    . Homeopathic Products (SIMILASAN DRY EYE RELIEF) SOLN Place 1 drop into both eyes as needed (dry eyes).    . irbesartan (AVAPRO) 300 MG tablet Take 1 tablet (300 mg total) by mouth daily. 90 tablet 3  . levothyroxine (SYNTHROID, LEVOTHROID) 100 MCG tablet TAKE 1 TABLET BY MOUTH DAILY BEFORE BREAKFAST. (Patient taking differently: TAKE 1/4 TABLET BY MOUTH DAILY BEFORE BREAKFAST.) 90 tablet 3  . Magnesium 400 MG CAPS Take 1 capsule by mouth 2  (two) times daily.    . metoprolol succinate (TOPROL-XL) 25 MG 24 hr tablet TAKE 1/2 (ONE HALF) TABLETS (12.5 MG TOTAL) BY MOUTH DAILY. 45 tablet 3  . NON FORMULARY Primal Lab Cardio Relax AO: Take 1 tab by mouth twice a day    . NON FORMULARY Primal MyoCor Balance: Take 1 tab by mouth twice a day    . OVER THE COUNTER MEDICATION Take 1 tablet by mouth daily. Natural thyroid support    . OVER THE COUNTER MEDICATION Take 1 capsule by mouth daily. polycosanol natural supplement    . POTASSIUM PO Take by mouth.    . Red Yeast Rice Extract (RED YEAST RICE PO) Take 1 capsule by mouth daily.    [name] triamcinolone (KENALOG) 0.025 % cream Apply 1 application topically 2 (two) times daily. To chest 80 g 0  . amLODipine (NORVASC) 10 MG tablet Take 1 tablet (10 mg total) by mouth daily. 90 tablet 3  . hydrochlorothiazide (HYDRODIURIL) 25 MG tablet Take 1 tablet (25 mg total) by mouth daily. 90 tablet 3   No current facility-administered medications on file prior to visit.    Allergies  Allergen Reactions  . Broccoli [Brassica Oleracea]     GI upset  . Chicken Protein     Does not eat meat  . Milk-Related Compounds     GI upset  .  Mushroom Extract Complex     GI upset  . Onion     GI upset  . Penicillins Other (See Comments)    "skin crawls"    Past Medical History:  Diagnosis Date  . Carotid artery occlusion   . Hyperlipidemia 08/17/2013  . Hypertension   . Hypothyroidism    Refuses conventional medicine  . PAF (paroxysmal atrial fibrillation) ([place])   . Seasonal allergies     Past Surgical History:  Procedure Laterality Date  . APPENDECTOMY    . CAROTID ENDARTERECTOMY    . CERVICAL POLYPECTOMY    . DOPPLER ECHOCARDIOGRAPHY  03/02/1999   NORMAL LEFT VENTRICULAR SIZE. EF 55%  . ENDARTERECTOMY Left 09/24/2012   Procedure: ENDARTERECTOMY CAROTID;  Surgeon: Mal Misty, MD;  Location: [facility];  Service: Vascular;  Laterality: Left;  . EXTERNAL EAR SURGERY Right 1983   tympanoplasty with  patch  . PATCH ANGIOPLASTY Left 09/24/2012   Procedure: PATCH ANGIOPLASTY;  Surgeon: Mal Misty, MD;  Location: [place];  Service: Vascular;  Laterality: Left;  . TUBAL LIGATION      Social History   Tobacco Use  Smoking Status Never Smoker  Smokeless Tobacco Never Used    Social History   Substance and Sexual Activity  Alcohol Use No  . Alcohol/week: 0.0 standard drinks    Family History  Problem Relation Age of Onset  . Stroke Father   . Alzheimer's disease Mother     Review of Systems: As noted in HPI.   All other systems were reviewed and are negative.  Physical Exam: BP (!) 154/73   Pulse 80   Temp (!) 97.1 F (36.2 C)   Ht 5\' 9"  (1.753 m)   Wt 140 lb 12.8 oz (63.9 kg)   SpO2 97%   BMI 20.79 kg/m  GENERAL:  Well appearing WF in NAD HEENT:  Normal.  NECK:  No jugular venous distention, carotid upstroke brisk and symmetric, no bruits, no thyromegaly or adenopathy LUNGS:  Clear to auscultation bilaterally CHEST:  Unremarkable HEART:  RRR,  PMI not displaced or sustained,S1 and S2 within normal limits, no S3, no S4: no clicks, no rubs, no murmurs ABD:  Soft, nontender. BS +, no masses or bruits. No hepatomegaly, no splenomegaly EXT:  2 + pulses throughout, no edema, no cyanosis no clubbing SKIN:  Warm and dry.  No rashes NEURO:  Alert and oriented x 3. Cranial nerves II through XII intact. PSYCH:  Cognitively intact      LABORATORY DATA:    Lab Results  Component Value Date   WBC 5.3 07/28/2017   HGB 14.1 07/28/2017   HCT 42.9 07/28/2017   PLT 208 07/28/2017   GLUCOSE 101 (H) 01/05/2019   CHOL 186 07/28/2017   TRIG 79 07/28/2017   HDL 58 07/28/2017   LDLDIRECT 144.4 09/08/2012   LDLCALC 112 (H) 07/28/2017   ALT 23 07/28/2017   AST 24 07/28/2017   NA 142 01/05/2019   K 4.1 01/05/2019   CL 101 01/05/2019   CREATININE 0.88 01/05/2019   BUN 23 01/05/2019   CO2 25 01/05/2019   TSH 4.650 (H) 01/05/2019   INR 1.00 09/22/2012    Assessment /  Plan: 1. Carotid arterial disease s/p left CEA.  Continue ASA. Dopplers in October 2018 showed 40-59% RICA disease and no restenosis on the left. Refuses to take statin. On red yeast rice.   2. HTN - controlled. Continue higher dose of Tribenzor  and  Toprol XL 12.5  mg daily.   3. Hyperlipidemia. Does not want to take statin. On red yeast rice.   4. Paroxysmal AFib- remote history - no symptomatic recurrence. On digoxin. Last level 0.7.    5. Hypothyroidism on synthroid.   6. Hypercalcemia. PTH consistent with mild hyperparathyroidism. Calcium level improved on recent exam 10.7.   I will follow up in 6 months and check labs then including CBC, TFTs, CMET, lipids and dig level.

## 2019-07-22 ENCOUNTER — Other Ambulatory Visit: Payer: Self-pay

## 2019-07-22 ENCOUNTER — Ambulatory Visit (INDEPENDENT_AMBULATORY_CARE_PROVIDER_SITE_OTHER): Payer: MEDICARE | Admitting: Cardiology

## 2019-07-22 ENCOUNTER — Encounter: Payer: Self-pay | Admitting: Cardiology

## 2019-07-22 VITALS — BP 154/73 | HR 80 | Temp 97.1°F | Ht 69.0 in | Wt 140.8 lb

## 2019-07-22 DIAGNOSIS — E039 Hypothyroidism, unspecified: Secondary | ICD-10-CM

## 2019-07-22 DIAGNOSIS — I480 Paroxysmal atrial fibrillation: Secondary | ICD-10-CM | POA: Diagnosis not present

## 2019-07-22 DIAGNOSIS — I10 Essential (primary) hypertension: Secondary | ICD-10-CM

## 2019-07-22 DIAGNOSIS — E7800 Pure hypercholesterolemia, unspecified: Secondary | ICD-10-CM

## 2019-07-26 ENCOUNTER — Ambulatory Visit: Payer: Medicare Other | Attending: Internal Medicine

## 2019-07-26 DIAGNOSIS — Z23 Encounter for immunization: Secondary | ICD-10-CM

## 2019-07-26 NOTE — Progress Notes (Signed)
   Covid-19 Vaccination Clinic  Name:  Ashley Berg    MRN: [id number] DOB: 1945-05-11  07/26/2019  Ashley Berg was observed post Covid-19 immunization for 15 minutes without incident. She was provided with Vaccine Information Sheet and instruction to access the V-Safe system.   Ashley Berg was instructed to call 911 with any severe reactions post vaccine: [name] Difficulty breathing  . Swelling of face and throat  . A fast heartbeat  . A bad rash all over body  . Dizziness and weakness   Immunizations Administered    Name Date Dose VIS Date Route   Pfizer COVID-19 Vaccine 07/26/2019  2:43 PM 0.3 mL 05/19/2018 Intramuscular   Manufacturer: [employer], [employer]   Lot: [id number]   NDC: [address]-0

## 2020-01-10 ENCOUNTER — Telehealth: Payer: Self-pay | Admitting: Cardiology

## 2020-01-10 NOTE — Telephone Encounter (Signed)
Will forward this message to Dr. Elvis Coil nurse Elnita Maxwell, to follow-up with the pt, in regards to her medication.

## 2020-01-10 NOTE — Telephone Encounter (Signed)
Spoke to patient she stated she might need a prior authorization on Digoxin.Stated she will call back if she needs a prior [place].

## 2020-01-10 NOTE — Telephone Encounter (Signed)
Patient calling to speak with Elnita Maxwell in regards to her medication. She states she calls every year to get authorization on her meds and new tier changes.

## 2020-01-17 ENCOUNTER — Other Ambulatory Visit: Payer: Self-pay | Admitting: Cardiovascular Disease

## 2020-01-23 ENCOUNTER — Other Ambulatory Visit: Payer: Self-pay | Admitting: Cardiology

## 2020-01-26 DIAGNOSIS — I10 Essential (primary) hypertension: Secondary | ICD-10-CM | POA: Diagnosis not present

## 2020-01-26 DIAGNOSIS — E039 Hypothyroidism, unspecified: Secondary | ICD-10-CM | POA: Diagnosis not present

## 2020-01-26 DIAGNOSIS — E7800 Pure hypercholesterolemia, unspecified: Secondary | ICD-10-CM | POA: Diagnosis not present

## 2020-01-27 LAB — HEPATIC FUNCTION PANEL
ALT: 24 IU/L (ref 0–32)
AST: 33 IU/L (ref 0–40)
Albumin: 4.9 g/dL — ABNORMAL HIGH (ref 3.7–4.7)
Alkaline Phosphatase: 99 IU/L (ref 44–121)
Bilirubin Total: 0.7 mg/dL (ref 0.0–1.2)
Bilirubin, Direct: 0.23 mg/dL (ref 0.00–0.40)
Total Protein: 7.2 g/dL (ref 6.0–8.5)

## 2020-01-27 LAB — LIPID PANEL
Chol/HDL Ratio: 3.6 ratio (ref 0.0–4.4)
Cholesterol, Total: 212 mg/dL — ABNORMAL HIGH (ref 100–199)
HDL: 59 mg/dL (ref >39)
LDL Chol Calc (NIH): 131 mg/dL — ABNORMAL HIGH (ref 0–99)
Triglycerides: 122 mg/dL (ref 0–149)
VLDL Cholesterol Cal: 22 mg/dL (ref 5–40)

## 2020-01-27 LAB — BASIC METABOLIC PANEL
BUN/Creatinine Ratio: 26 (ref 12–28)
BUN: 23 mg/dL (ref 8–27)
CO2: 27 mmol/L (ref 20–29)
Calcium: 10.9 mg/dL — ABNORMAL HIGH (ref 8.7–10.3)
Chloride: 99 mmol/L (ref 96–106)
Creatinine, Ser: 0.87 mg/dL (ref 0.57–1.00)
GFR calc Af Amer: 75 mL/min/{1.73_m2} (ref >59)
GFR calc non Af Amer: 65 mL/min/{1.73_m2} (ref >59)
Glucose: 129 mg/dL — ABNORMAL HIGH (ref 65–99)
Potassium: 4.2 mmol/L (ref 3.5–5.2)
Sodium: 139 mmol/L (ref 134–144)

## 2020-01-27 LAB — CBC WITH DIFFERENTIAL/PLATELET
Basophils Absolute: 0 10*3/uL (ref 0.0–0.2)
Basos: 1 %
EOS (ABSOLUTE): 0.1 10*3/uL (ref 0.0–0.4)
Eos: 3 %
Hematocrit: 43.7 % (ref 34.0–46.6)
Hemoglobin: 14.9 g/dL (ref 11.1–15.9)
Immature Grans (Abs): 0 10*3/uL (ref 0.0–0.1)
Immature Granulocytes: 1 %
Lymphocytes Absolute: 1 10*3/uL (ref 0.7–3.1)
Lymphs: 23 %
MCH: 33 pg (ref 26.6–33.0)
MCHC: 34.1 g/dL (ref 31.5–35.7)
MCV: 97 fL (ref 79–97)
Monocytes Absolute: 0.4 10*3/uL (ref 0.1–0.9)
Monocytes: 11 %
Neutrophils Absolute: 2.5 10*3/uL (ref 1.4–7.0)
Neutrophils: 61 %
Platelets: 188 10*3/uL (ref 150–450)
RBC: 4.51 x10E6/uL (ref 3.77–5.28)
RDW: 13.8 % (ref 11.7–15.4)
WBC: 4.1 10*3/uL (ref 3.4–10.8)

## 2020-01-27 LAB — TSH: TSH: 6.88 u[IU]/mL — ABNORMAL HIGH (ref 0.450–4.500)

## 2020-01-27 LAB — T4: T4, Total: 7.7 ug/dL (ref 4.5–12.0)

## 2020-01-27 LAB — DIGOXIN LEVEL: Digoxin, Serum: 0.4 ng/mL — ABNORMAL LOW (ref 0.5–0.9)

## 2020-02-01 NOTE — Progress Notes (Signed)
Ashley Berg Date of Birth: May 01, 1945   History of Present Illness: Ashley Berg is seen back today for follow up HTN. She has a history of HTN and hyperlipidemia. She is s/p left CEA. She has a history of hypothyroidism.  She does not take her blood pressure at home because it makes her feel too anxious.    On prior visit she was noted to have a chronically elevated calcium level. PTH level was mildly elevated. No history of renal stones. No fractures. Normal renal function. She has not been taking calcium supplements. She did stop her supplement of Vitamin D. We did discuss potential for parathyroidectomy but deferred.   Carotid dopplers in October 2019 showed nonobstructive disease.  On follow up today she is doing very well.    She denies any palpitations, SOB, chest pain. No edema. She is taking a low dose of levothyroxine plus a thyroid supplement.  Current Outpatient Medications on File Prior to Visit  Medication Sig Dispense Refill  . amLODipine (NORVASC) 10 MG tablet TAKE 1 TABLET BY MOUTH EVERY DAY 90 tablet 3  . aspirin EC 81 MG tablet Take 1 tablet (81 mg total) by mouth daily. 30 tablet 11  . b complex vitamins tablet Take 1 tablet by mouth daily.     . digoxin (LANOXIN) 0.125 MG tablet TAKE 1 TABLET BY MOUTH EVERY DAY 90 tablet 2  . Glucosamine-Chondroitin-MSM (TRIPLE FLEX PO) Take 1 tablet by mouth as directed.    . Homeopathic Products (SIMILASAN DRY EYE RELIEF) SOLN Place 1 drop into both eyes as needed (dry eyes).    . hydrochlorothiazide (HYDRODIURIL) 25 MG tablet TAKE 1 TABLET BY MOUTH EVERY DAY 90 tablet 3  . irbesartan (AVAPRO) 300 MG tablet TAKE 1 TABLET BY MOUTH EVERY DAY 90 tablet 3  . levothyroxine (SYNTHROID, LEVOTHROID) 100 MCG tablet TAKE 1 TABLET BY MOUTH DAILY BEFORE BREAKFAST. (Patient taking differently: TAKE 1/4 TABLET BY MOUTH DAILY BEFORE BREAKFAST.) 90 tablet 3  . Magnesium 400 MG CAPS Take 1 capsule by mouth 2 (two) times daily.    . metoprolol  succinate (TOPROL-XL) 25 MG 24 hr tablet TAKE 1/2 (ONE HALF) TABLETS (12.5 MG TOTAL) BY MOUTH DAILY. 45 tablet 3  . NON FORMULARY Primal Lab Cardio Relax AO: Take 1 tab by mouth twice a day    . NON FORMULARY Primal MyoCor Balance: Take 1 tab by mouth twice a day    . OVER THE COUNTER MEDICATION Take 1 tablet by mouth daily. Natural thyroid support    . OVER THE COUNTER MEDICATION Take 1 capsule by mouth daily. polycosanol natural supplement    . POTASSIUM PO Take by mouth.    . Red Yeast Rice Extract (RED YEAST RICE PO) Take 1 capsule by mouth daily.    [name] triamcinolone (KENALOG) 0.025 % cream Apply 1 application topically 2 (two) times daily. To chest 80 g 0   No current facility-administered medications on file prior to visit.    Allergies  Allergen Reactions  . Broccoli [Brassica Oleracea]     GI upset  . Chicken Protein     Does not eat meat  . Milk-Related Compounds     GI upset  . Mushroom Extract Complex     GI upset  . Onion     GI upset  . Penicillins Other (See Comments)    "skin crawls"    Past Medical History:  Diagnosis Date  . Carotid artery occlusion   . Hyperlipidemia 08/17/2013  .  Hypertension   . Hypothyroidism    Refuses conventional medicine  . PAF (paroxysmal atrial fibrillation) ([facility])   . Seasonal allergies     Past Surgical History:  Procedure Laterality Date  . APPENDECTOMY    . CAROTID ENDARTERECTOMY    . CERVICAL POLYPECTOMY    . DOPPLER ECHOCARDIOGRAPHY  03/02/1999   NORMAL LEFT VENTRICULAR SIZE. EF 55%  . ENDARTERECTOMY Left 09/24/2012   Procedure: ENDARTERECTOMY CAROTID;  Surgeon: Pryor Ochoa, MD;  Location: [facility];  Service: Vascular;  Laterality: Left;  . EXTERNAL EAR SURGERY Right 1983   tympanoplasty with patch  . PATCH ANGIOPLASTY Left 09/24/2012   Procedure: PATCH ANGIOPLASTY;  Surgeon: Pryor Ochoa, MD;  Location: [facility] OR;  Service: Vascular;  Laterality: Left;  . TUBAL LIGATION      Social History   Tobacco Use  Smoking Status  Never Smoker  Smokeless Tobacco Never Used    Social History   Substance and Sexual Activity  Alcohol Use No  . Alcohol/week: 0.0 standard drinks    Family History  Problem Relation Age of Onset  . Stroke Father   . Alzheimer's disease Mother     Review of Systems: As noted in HPI.   All other systems were reviewed and are negative.  Physical Exam: BP (!) 148/74 (BP Location: Left Arm, Patient Position: Sitting, Cuff Size: Normal)   Pulse 72   Ht 5\' 9"  (1.753 m)   Wt 144 lb (65.3 kg)   BMI 21.27 kg/m  GENERAL:  Well appearing WF in NAD HEENT:  Normal.  NECK:  No jugular venous distention, carotid upstroke brisk and symmetric, no bruits, no thyromegaly or adenopathy LUNGS:  Clear to auscultation bilaterally CHEST:  Unremarkable HEART:  RRR,  PMI not displaced or sustained,S1 and S2 within normal limits, no S3, no S4: no clicks, no rubs, no murmurs ABD:  Soft, nontender. BS +, no masses or bruits. No hepatomegaly, no splenomegaly EXT:  2 + pulses throughout, no edema, no cyanosis no clubbing SKIN:  Warm and dry.  No rashes NEURO:  Alert and oriented x 3. Cranial nerves II through XII intact. PSYCH:  Cognitively intact      LABORATORY DATA:    Lab Results  Component Value Date   WBC 4.1 01/26/2020   HGB 14.9 01/26/2020   HCT 43.7 01/26/2020   PLT 188 01/26/2020   GLUCOSE 129 (H) 01/26/2020   CHOL 212 (H) 01/26/2020   TRIG 122 01/26/2020   HDL 59 01/26/2020   LDLDIRECT 144.4 09/08/2012   LDLCALC 131 (H) 01/26/2020   ALT 24 01/26/2020   AST 33 01/26/2020   NA 139 01/26/2020   K 4.2 01/26/2020   CL 99 01/26/2020   CREATININE 0.87 01/26/2020   BUN 23 01/26/2020   CO2 27 01/26/2020   TSH 6.880 (H) 01/26/2020   INR 1.00 09/22/2012   Ecg today shows NSR with LAD, RV conduction delay, nonspecific ST abnormality. I have personally reviewed and interpreted this study.   Assessment / Plan: 1. Carotid arterial disease s/p left CEA.  Continue ASA. Dopplers in  October 2019 showed no significant stenosis.    2. HTN - controlled. Continue current therapy.  3. Hyperlipidemia. Discussed recent lab work. LDL 130 despite diet and red yeast rice. Has been reluctant to take a statin just because what she has read in the past. After discussion she is willing to try a low dose of atorvastatin and we will see how she responds.   4.  Paroxysmal AFib- remote history - no symptomatic recurrence. On digoxin. Last level 0.4.   5. Hypothyroidism on synthroid. TSH mildly elevated but free T4 is normal.   6. Hypercalcemia. PTH consistent with mild hyperparathyroidism. Calcium level has been stable for years and she is asymptomatic.    I will follow up in 6 months

## 2020-02-03 ENCOUNTER — Ambulatory Visit (INDEPENDENT_AMBULATORY_CARE_PROVIDER_SITE_OTHER): Payer: MEDICARE | Admitting: Cardiology

## 2020-02-03 ENCOUNTER — Encounter: Payer: Self-pay | Admitting: Cardiology

## 2020-02-03 VITALS — BP 148/74 | HR 72 | Ht 69.0 in | Wt 144.0 lb

## 2020-02-03 DIAGNOSIS — I10 Essential (primary) hypertension: Secondary | ICD-10-CM

## 2020-02-03 DIAGNOSIS — E213 Hyperparathyroidism, unspecified: Secondary | ICD-10-CM | POA: Diagnosis not present

## 2020-02-03 DIAGNOSIS — E7800 Pure hypercholesterolemia, unspecified: Secondary | ICD-10-CM

## 2020-02-03 DIAGNOSIS — I480 Paroxysmal atrial fibrillation: Secondary | ICD-10-CM

## 2020-02-03 DIAGNOSIS — I6522 Occlusion and stenosis of left carotid artery: Secondary | ICD-10-CM

## 2020-02-03 MED ORDER — ATORVASTATIN CALCIUM 10 MG PO TABS
5.0000 mg | ORAL_TABLET | Freq: Every day | ORAL | 3 refills | Status: DC
Start: 2020-02-03 — End: 2020-10-25

## 2020-02-03 MED ORDER — LEVOTHYROXINE SODIUM 100 MCG PO TABS: ORAL_TABLET | ORAL | 3 refills | Status: DC

## 2020-02-03 NOTE — Addendum Note (Signed)
Addended by: Neoma Laming on: 02/03/2020 04:47 PM   Modules accepted: Orders

## 2020-02-03 NOTE — Patient Instructions (Signed)
Start lipitor (atorvastatin) 5 mg daily for cholesterol   We will repeat lab work in 3 months.

## 2020-02-24 ENCOUNTER — Other Ambulatory Visit: Payer: Self-pay | Admitting: Cardiology

## 2020-04-07 ENCOUNTER — Telehealth: Payer: Self-pay | Admitting: Cardiology

## 2020-04-07 MED ORDER — METOPROLOL SUCCINATE ER 25 MG PO TB24
ORAL_TABLET | ORAL | 8 refills | Status: DC
Start: 2020-04-07 — End: 2021-01-05

## 2020-04-07 NOTE — Telephone Encounter (Signed)
°*  STAT* If patient is at the pharmacy, call can be transferred to refill team.   1. Which medications need to be refilled? (please list name of each medication and dose if known)   metoprolol succinate (TOPROL-XL) 25 MG 24 hr tablet  2. Which pharmacy/location (including street and city if local pharmacy) is medication to be sent to?  [id number] - [place], [place] - [address]  3. Do they need a 30 day or 90 day supply? 30

## 2020-05-04 LAB — LIPID PANEL
Chol/HDL Ratio: 2.7 ratio (ref 0.0–4.4)
Cholesterol, Total: 176 mg/dL (ref 100–199)
HDL: 65 mg/dL (ref >39)
LDL Chol Calc (NIH): 98 mg/dL (ref 0–99)
Triglycerides: 66 mg/dL (ref 0–149)
VLDL Cholesterol Cal: 13 mg/dL (ref 5–40)

## 2020-05-04 LAB — HEPATIC FUNCTION PANEL
ALT: 31 IU/L (ref 0–32)
AST: 33 IU/L (ref 0–40)
Albumin: 4.8 g/dL — ABNORMAL HIGH (ref 3.7–4.7)
Alkaline Phosphatase: 104 IU/L (ref 44–121)
Bilirubin Total: 0.7 mg/dL (ref 0.0–1.2)
Bilirubin, Direct: 0.24 mg/dL (ref 0.00–0.40)
Total Protein: 6.8 g/dL (ref 6.0–8.5)

## 2020-07-29 NOTE — Progress Notes (Deleted)
Ashley Berg Date of Birth: 1946-02-23   History of Present Illness: Ashley Berg is seen back today for follow up HTN. She has a history of HTN and hyperlipidemia. She is s/p left CEA. She has a history of hypothyroidism.  She does not take her blood pressure at home because it makes her feel too anxious.    On prior visit she was noted to have a chronically elevated calcium level. PTH level was mildly elevated. No history of renal stones. No fractures. Normal renal function. She has not been taking calcium supplements. She did stop her supplement of Vitamin D. We did discuss potential for parathyroidectomy but deferred.   Carotid dopplers in October 2019 showed nonobstructive disease.  On follow up today she is doing very well.    She denies any palpitations, SOB, chest pain. No edema. She is taking a low dose of levothyroxine plus a thyroid supplement.  Current Outpatient Medications on File Prior to Visit  Medication Sig Dispense Refill  . amLODipine (NORVASC) 10 MG tablet TAKE 1 TABLET BY MOUTH EVERY DAY 90 tablet 3  . aspirin EC 81 MG tablet Take 1 tablet (81 mg total) by mouth daily. 30 tablet 11  . atorvastatin (LIPITOR) 10 MG tablet Take 0.5 tablets (5 mg total) by mouth daily. 45 tablet 3  . b complex vitamins tablet Take 1 tablet by mouth daily.     . digoxin (LANOXIN) 0.125 MG tablet TAKE 1 TABLET BY MOUTH EVERY DAY 90 tablet 2  . Glucosamine-Chondroitin-MSM (TRIPLE FLEX PO) Take 1 tablet by mouth as directed.    . Homeopathic Products (SIMILASAN DRY EYE RELIEF) SOLN Place 1 drop into both eyes as needed (dry eyes).    . hydrochlorothiazide (HYDRODIURIL) 25 MG tablet TAKE 1 TABLET BY MOUTH EVERY DAY 90 tablet 3  . irbesartan (AVAPRO) 300 MG tablet TAKE 1 TABLET BY MOUTH EVERY DAY 90 tablet 3  . levothyroxine (SYNTHROID) 100 MCG tablet TAKE 1/4 TABLET BY MOUTH DAILY BEFORE BREAKFAST. 90 tablet 3  . Magnesium 400 MG CAPS Take 1 capsule by mouth 2 (two) times daily.    . metoprolol  succinate (TOPROL-XL) 25 MG 24 hr tablet Please schedule an appt for future refills 45 tablet 8  . NON FORMULARY Primal Lab Cardio Relax AO: Take 1 tab by mouth twice a day    . NON FORMULARY Primal MyoCor Balance: Take 1 tab by mouth twice a day    . OVER THE COUNTER MEDICATION Take 1 tablet by mouth daily. Natural thyroid support    . OVER THE COUNTER MEDICATION Take 1 capsule by mouth daily. polycosanol natural supplement    . POTASSIUM PO Take by mouth.    . Red Yeast Rice Extract (RED YEAST RICE PO) Take 1 capsule by mouth daily.    [name] triamcinolone (KENALOG) 0.025 % cream Apply 1 application topically 2 (two) times daily. To chest 80 g 0   No current facility-administered medications on file prior to visit.    Allergies  Allergen Reactions  . Broccoli [Brassica Oleracea]     GI upset  . Chicken Protein     Does not eat meat  . Milk-Related Compounds     GI upset  . Mushroom Extract Complex     GI upset  . Onion     GI upset  . Penicillins Other (See Comments)    "skin crawls"    Past Medical History:  Diagnosis Date  . Carotid artery occlusion   .  Hyperlipidemia 08/17/2013  . Hypertension   . Hypothyroidism    Refuses conventional medicine  . PAF (paroxysmal atrial fibrillation) ([facility])   . Seasonal allergies     Past Surgical History:  Procedure Laterality Date  . APPENDECTOMY    . CAROTID ENDARTERECTOMY    . CERVICAL POLYPECTOMY    . DOPPLER ECHOCARDIOGRAPHY  03/02/1999   NORMAL LEFT VENTRICULAR SIZE. EF 55%  . ENDARTERECTOMY Left 09/24/2012   Procedure: ENDARTERECTOMY CAROTID;  Surgeon: Pryor Ochoa, MD;  Location: [facility] OR;  Service: Vascular;  Laterality: Left;  . EXTERNAL EAR SURGERY Right 1983   tympanoplasty with patch  . PATCH ANGIOPLASTY Left 09/24/2012   Procedure: PATCH ANGIOPLASTY;  Surgeon: Pryor Ochoa, MD;  Location: [facility] OR;  Service: Vascular;  Laterality: Left;  . TUBAL LIGATION      Social History   Tobacco Use  Smoking Status Never Smoker   Smokeless Tobacco Never Used    Social History   Substance and Sexual Activity  Alcohol Use No  . Alcohol/week: 0.0 standard drinks    Family History  Problem Relation Age of Onset  . Stroke Father   . Alzheimer's disease Mother     Review of Systems: As noted in HPI.   All other systems were reviewed and are negative.  Physical Exam: There were no vitals taken for this visit. GENERAL:  Well appearing WF in NAD HEENT:  Normal.  NECK:  No jugular venous distention, carotid upstroke brisk and symmetric, no bruits, no thyromegaly or adenopathy LUNGS:  Clear to auscultation bilaterally CHEST:  Unremarkable HEART:  RRR,  PMI not displaced or sustained,S1 and S2 within normal limits, no S3, no S4: no clicks, no rubs, no murmurs ABD:  Soft, nontender. BS +, no masses or bruits. No hepatomegaly, no splenomegaly EXT:  2 + pulses throughout, no edema, no cyanosis no clubbing SKIN:  Warm and dry.  No rashes NEURO:  Alert and oriented x 3. Cranial nerves II through XII intact. PSYCH:  Cognitively intact      LABORATORY DATA:    Lab Results  Component Value Date   WBC 4.1 01/26/2020   HGB 14.9 01/26/2020   HCT 43.7 01/26/2020   PLT 188 01/26/2020   GLUCOSE 129 (H) 01/26/2020   CHOL 176 05/03/2020   TRIG 66 05/03/2020   HDL 65 05/03/2020   LDLDIRECT 144.4 09/08/2012   LDLCALC 98 05/03/2020   ALT 31 05/03/2020   AST 33 05/03/2020   NA 139 01/26/2020   K 4.2 01/26/2020   CL 99 01/26/2020   CREATININE 0.87 01/26/2020   BUN 23 01/26/2020   CO2 27 01/26/2020   TSH 6.880 (H) 01/26/2020   INR 1.00 09/22/2012   Ecg today shows NSR with LAD, RV conduction delay, nonspecific ST abnormality. I have personally reviewed and interpreted this study.   Assessment / Plan: 1. Carotid arterial disease s/p left CEA.  Continue ASA. Dopplers in October 2019 showed no significant stenosis.    2. HTN - controlled. Continue current therapy.  3. Hyperlipidemia. Discussed recent lab  work. LDL 130 despite diet and red yeast rice. Has been reluctant to take a statin just because what she has read in the past. After discussion she is willing to try a low dose of atorvastatin and we will see how she responds.   4. Paroxysmal AFib- remote history - no symptomatic recurrence. On digoxin. Last level 0.4.   5. Hypothyroidism on synthroid. TSH mildly elevated but free T4 is normal.  6. Hypercalcemia. PTH consistent with mild hyperparathyroidism. Calcium level has been stable for years and she is asymptomatic.    I will follow up in 6 months

## 2020-08-02 ENCOUNTER — Telehealth: Payer: Self-pay

## 2020-08-02 NOTE — Telephone Encounter (Signed)
Called patient left message on personal voice mail Dr.Jordan will not be in office tomorrow 08/03/20.Appointment at 2:30 pm has been cancelled.Advised to call me back to reschedule.

## 2020-08-03 ENCOUNTER — Ambulatory Visit: Payer: MEDICARE | Admitting: Cardiology

## 2020-08-24 ENCOUNTER — Ambulatory Visit: Payer: MEDICARE | Admitting: Physician Assistant

## 2020-10-17 ENCOUNTER — Other Ambulatory Visit: Payer: Self-pay | Admitting: Cardiovascular Disease

## 2020-10-17 NOTE — Telephone Encounter (Signed)
Rx(s) sent to pharmacy electronically.  

## 2020-10-24 ENCOUNTER — Other Ambulatory Visit: Payer: Self-pay | Admitting: Cardiology

## 2020-10-26 NOTE — Progress Notes (Signed)
Ashley Berg Date of Birth: 03/03/1946   History of Present Illness: Ashley Berg is seen back today for follow up HTN. She has a history of HTN and hyperlipidemia. She is s/p left CEA. She has a history of hypothyroidism.  She does not take her blood pressure at home because it makes her feel too anxious.    On prior visit she was noted to have a chronically elevated calcium level. PTH level was mildly elevated. No history of renal stones. No fractures. Normal renal function. She has not been taking calcium supplements. She did stop her supplement of Vitamin D. We did discuss potential for parathyroidectomy but deferred.   Carotid dopplers in October 2019 showed nonobstructive disease.  On follow up today she is doing very well.    She denies any palpitations, SOB, chest pain. No edema. She is taking a low dose of levothyroxine plus a thyroid supplement. She is tolerating low dose lipitor well.   Current Outpatient Medications on File Prior to Visit  Medication Sig Dispense Refill   amLODipine (NORVASC) 10 MG tablet TAKE 1 TABLET BY MOUTH EVERY DAY 90 tablet 3   aspirin EC 81 MG tablet Take 1 tablet (81 mg total) by mouth daily. 30 tablet 11   atorvastatin (LIPITOR) 10 MG tablet TAKE 1/2 TABLET BY MOUTH DAILY 45 tablet 3   b complex vitamins tablet Take 1 tablet by mouth daily.      digoxin (LANOXIN) 0.125 MG tablet TAKE 1 TABLET BY MOUTH EVERY DAY 90 tablet 1   Glucosamine-Chondroitin-MSM (TRIPLE FLEX PO) Take 1 tablet by mouth as directed.     Homeopathic Products (SIMILASAN DRY EYE RELIEF) SOLN Place 1 drop into both eyes as needed (dry eyes).     hydrochlorothiazide (HYDRODIURIL) 25 MG tablet TAKE 1 TABLET BY MOUTH EVERY DAY 90 tablet 3   irbesartan (AVAPRO) 300 MG tablet TAKE 1 TABLET BY MOUTH EVERY DAY 90 tablet 3   levothyroxine (SYNTHROID) 100 MCG tablet TAKE 1/4 TABLET BY MOUTH DAILY BEFORE BREAKFAST. 90 tablet 3   Magnesium 400 MG CAPS Take 1 capsule by mouth 2 (two) times daily.      metoprolol succinate (TOPROL-XL) 25 MG 24 hr tablet Please schedule an appt for future refills 45 tablet 8   NON FORMULARY Primal Lab Cardio Relax AO: Take 1 tab by mouth twice a day     NON FORMULARY Primal MyoCor Balance: Take 1 tab by mouth twice a day     OVER THE COUNTER MEDICATION Take 1 tablet by mouth daily. Natural thyroid support     OVER THE COUNTER MEDICATION Take 1 capsule by mouth daily. polycosanol natural supplement     POTASSIUM PO Take by mouth.     Red Yeast Rice Extract (RED YEAST RICE PO) Take 1 capsule by mouth daily.     triamcinolone (KENALOG) 0.025 % cream Apply 1 application topically 2 (two) times daily. To chest (Patient not taking: Reported on 10/31/2020) 80 g 0   No current facility-administered medications on file prior to visit.    Allergies  Allergen Reactions   Broccoli [Brassica Oleracea]     GI upset   Chicken Protein     Does not eat meat   Milk-Related Compounds     GI upset   Mushroom Extract Complex     GI upset   Onion     GI upset   Penicillins Other (See Comments)    "skin crawls"    Past Medical  History:  Diagnosis Date   Carotid artery occlusion    Hyperlipidemia 08/17/2013   Hypertension    Hypothyroidism    Refuses conventional medicine   PAF (paroxysmal atrial fibrillation) (HCC)    Seasonal allergies     Past Surgical History:  Procedure Laterality Date   APPENDECTOMY     CAROTID ENDARTERECTOMY     CERVICAL POLYPECTOMY     DOPPLER ECHOCARDIOGRAPHY  03/02/1999   NORMAL LEFT VENTRICULAR SIZE. EF 55%   ENDARTERECTOMY Left 09/24/2012   Procedure: ENDARTERECTOMY CAROTID;  Surgeon: Pryor Ochoa, MD;  Location: [facility] OR;  Service: Vascular;  Laterality: Left;   EXTERNAL EAR SURGERY Right 1983   tympanoplasty with patch   PATCH ANGIOPLASTY Left 09/24/2012   Procedure: PATCH ANGIOPLASTY;  Surgeon: Pryor Ochoa, MD;  Location: [facility] OR;  Service: Vascular;  Laterality: Left;   TUBAL LIGATION      Social History   Tobacco Use   Smoking Status Never  Smokeless Tobacco Never    Social History   Substance and Sexual Activity  Alcohol Use No   Alcohol/week: 0.0 standard drinks    Family History  Problem Relation Age of Onset   Stroke Father    Alzheimer's disease Mother     Review of Systems: As noted in HPI.   All other systems were reviewed and are negative.  Physical Exam: BP (!) 153/66   Pulse 79   Ht 5\' 10"  (1.778 m)   Wt 146 lb 6.4 oz (66.4 kg)   SpO2 99%   BMI 21.01 kg/m  GENERAL:  Well appearing WF in NAD HEENT:  Normal.  NECK:  No jugular venous distention, carotid upstroke brisk and symmetric, no bruits, no thyromegaly or adenopathy LUNGS:  Clear to auscultation bilaterally CHEST:  Unremarkable HEART:  RRR,  PMI not displaced or sustained,S1 and S2 within normal limits, no S3, no S4: no clicks, no rubs, no murmurs ABD:  Soft, nontender. BS +, no masses or bruits. No hepatomegaly, no splenomegaly EXT:  2 + pulses throughout, no edema, no cyanosis no clubbing SKIN:  Warm and dry.  No rashes NEURO:  Alert and oriented x 3. Cranial nerves II through XII intact. PSYCH:  Cognitively intact      LABORATORY DATA:    Lab Results  Component Value Date   WBC 4.1 01/26/2020   HGB 14.9 01/26/2020   HCT 43.7 01/26/2020   PLT 188 01/26/2020   GLUCOSE 129 (H) 01/26/2020   CHOL 176 05/03/2020   TRIG 66 05/03/2020   HDL 65 05/03/2020   LDLDIRECT 144.4 09/08/2012   LDLCALC 98 05/03/2020   ALT 31 05/03/2020   AST 33 05/03/2020   NA 139 01/26/2020   K 4.2 01/26/2020   CL 99 01/26/2020   CREATININE 0.87 01/26/2020   BUN 23 01/26/2020   CO2 27 01/26/2020   TSH 6.880 (H) 01/26/2020   INR 1.00 09/22/2012     Assessment / Plan: 1. Carotid arterial disease s/p left CEA.  Continue ASA. Dopplers in October 2019 showed no significant stenosis.    2. HTN - under fair control. Continue current therapy.  3. Hyperlipidemia. LDL has decreased from 103 to 98 on low dose lipitor. We discussed  increasing lipitor to 10 mg daily but she wants to wait until we check lab work again in 6 months.  4. Paroxysmal AFib- remote history - no symptomatic recurrence. On digoxin. Last level 0.4.   5. Hypothyroidism on synthroid. Will repeat TFTS in 6 months.  6. Hypercalcemia. PTH consistent with mild hyperparathyroidism. Calcium level has been stable for years and she is asymptomatic.    I will follow up in 6 months with lab.

## 2020-10-31 ENCOUNTER — Other Ambulatory Visit: Payer: Self-pay

## 2020-10-31 ENCOUNTER — Encounter: Payer: Self-pay | Admitting: Cardiology

## 2020-10-31 ENCOUNTER — Ambulatory Visit (INDEPENDENT_AMBULATORY_CARE_PROVIDER_SITE_OTHER): Payer: MEDICARE | Admitting: Cardiology

## 2020-10-31 VITALS — BP 153/66 | HR 79 | Ht 70.0 in | Wt 146.4 lb

## 2020-10-31 DIAGNOSIS — E7800 Pure hypercholesterolemia, unspecified: Secondary | ICD-10-CM

## 2020-10-31 DIAGNOSIS — I6522 Occlusion and stenosis of left carotid artery: Secondary | ICD-10-CM | POA: Diagnosis not present

## 2020-10-31 DIAGNOSIS — E213 Hyperparathyroidism, unspecified: Secondary | ICD-10-CM | POA: Diagnosis not present

## 2020-10-31 DIAGNOSIS — E039 Hypothyroidism, unspecified: Secondary | ICD-10-CM

## 2020-10-31 DIAGNOSIS — I10 Essential (primary) hypertension: Secondary | ICD-10-CM

## 2020-12-26 ENCOUNTER — Other Ambulatory Visit: Payer: Self-pay | Admitting: Cardiology

## 2021-01-05 ENCOUNTER — Telehealth: Payer: Self-pay | Admitting: Cardiology

## 2021-01-05 MED ORDER — HYDROCHLOROTHIAZIDE 25 MG PO TABS: 25.0000 mg | ORAL_TABLET | Freq: Every day | ORAL | 3 refills | Status: DC

## 2021-01-05 MED ORDER — ATORVASTATIN CALCIUM 10 MG PO TABS: 5.0000 mg | ORAL_TABLET | Freq: Every day | ORAL | 3 refills | Status: DC

## 2021-01-05 MED ORDER — METOPROLOL SUCCINATE ER 25 MG PO TB24: ORAL_TABLET | ORAL | 3 refills | Status: DC

## 2021-01-05 MED ORDER — AMLODIPINE BESYLATE 10 MG PO TABS: 10.0000 mg | ORAL_TABLET | Freq: Every day | ORAL | 3 refills | Status: DC

## 2021-01-05 MED ORDER — IRBESARTAN 300 MG PO TABS
300.0000 mg | ORAL_TABLET | Freq: Every day | ORAL | 3 refills | Status: DC
Start: 2021-01-05 — End: 2021-01-19

## 2021-01-05 NOTE — Telephone Encounter (Signed)
*  STAT* If patient is at the pharmacy, call can be transferred to refill team.   1. Which medications need to be refilled? (please list name of each medication and dose if known)  metoprolol succinate (TOPROL-XL) 25 MG 24 hr tablet irbesartan (AVAPRO) 300 MG tablet hydrochlorothiazide (HYDRODIURIL) 25 MG tablet  2. Which pharmacy/location (including street and city if local pharmacy) is medication to be sent to? [id number] - [place], [place] - [address]  3. Do they need a 30 day or 90 day supply? 90 with refills  Patient saw Dr. Sw[place]iland 10/31/20

## 2021-01-05 NOTE — Telephone Encounter (Signed)
Spoke to patient she stated she would like to change pharmacies to [facility].Refills sent to [facility].

## 2021-01-18 ENCOUNTER — Telehealth: Payer: Self-pay | Admitting: Cardiology

## 2021-01-18 NOTE — Telephone Encounter (Signed)
Returned call to patient left message on personal voice mail to call back. 

## 2021-01-18 NOTE — Telephone Encounter (Signed)
   Pt is requesting to speak with Elnita Maxwell

## 2021-01-19 MED ORDER — HYDROCHLOROTHIAZIDE 25 MG PO TABS: 25.0000 mg | ORAL_TABLET | Freq: Every day | ORAL | 3 refills | Status: DC

## 2021-01-19 MED ORDER — METOPROLOL SUCCINATE ER 25 MG PO TB24: ORAL_TABLET | ORAL | 3 refills | Status: DC

## 2021-01-19 MED ORDER — DIGOXIN 125 MCG PO TABS: 125.0000 ug | ORAL_TABLET | Freq: Every day | ORAL | 3 refills | Status: DC

## 2021-01-19 MED ORDER — ATORVASTATIN CALCIUM 10 MG PO TABS: 5.0000 mg | ORAL_TABLET | Freq: Every day | ORAL | 3 refills | Status: DC

## 2021-01-19 MED ORDER — LEVOTHYROXINE SODIUM 100 MCG PO TABS: ORAL_TABLET | ORAL | 3 refills | Status: DC

## 2021-01-19 MED ORDER — AMLODIPINE BESYLATE 10 MG PO TABS: 10.0000 mg | ORAL_TABLET | Freq: Every day | ORAL | 3 refills | Status: DC

## 2021-01-19 MED ORDER — IRBESARTAN 300 MG PO TABS: 300.0000 mg | ORAL_TABLET | Freq: Every day | ORAL | 3 refills | Status: DC

## 2021-01-19 NOTE — Telephone Encounter (Signed)
Spoke to patient she stated she will not be changing pharmacies.She requested 90 day refills to [facility] [place].Refills sent.

## 2021-01-19 NOTE — Telephone Encounter (Signed)
Patient is returning call.  °

## 2021-01-22 NOTE — Telephone Encounter (Signed)
   Pt is calling back and would like to speak with Elnita Maxwell again

## 2021-01-22 NOTE — Telephone Encounter (Signed)
Returned call to patient no answer.Left message on personal voice mail to call back. 

## 2021-01-23 NOTE — Telephone Encounter (Signed)
I would recommend the flu shot but not whooping cough since she had prior reaction  Ebony Yorio [place] MD, [facility]

## 2021-01-30 NOTE — Telephone Encounter (Signed)
In that case she can get the whooping cough vaccine but not the flu  Branch Pacitti [place] MD, [facility]

## 2021-04-04 ENCOUNTER — Telehealth: Payer: Self-pay | Admitting: Cardiology

## 2021-04-04 NOTE — Telephone Encounter (Signed)
Pt c/o medication issue:  1. Name of Medication:  metoprolol succinate (TOPROL-XL) 25 MG 24 hr tablet  2. How are you currently taking this medication (dosage and times per day)?   3. Are you having a reaction (difficulty breathing--STAT)?   4. What is your medication issue?   Patient is requesting to speak with Dr. Elvis Coil nurse. She states she has concerns with taking a whole tablet daily because in the past she has only taken half. She would like to know why Metoprolol was increased. Please advise when able.

## 2021-04-04 NOTE — Telephone Encounter (Signed)
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 Will get message over to [place] who is out of office today.

## 2021-04-05 NOTE — Addendum Note (Signed)
Addended by: Kathyrn Lass on: 04/05/2021 12:08 PM   Modules accepted: Orders

## 2021-04-05 NOTE — Telephone Encounter (Signed)
Spoke to patient she stated when she picked up her Toprol prescription directions were different.Stated she has been taking Toprol 25 mg 1/2 tablet daily.New directions say 1 tablet 25 mg daily.Advised to continue 25 mg 1/2 tablet daily.Stated she is doing well with this dose.I will make Dr.Jordan aware.

## 2021-04-05 NOTE — Telephone Encounter (Signed)
Called patient no answer. Left message to call back.

## 2021-04-05 NOTE — Telephone Encounter (Signed)
Patient returning call. She states she can get a call this evening or Friday.

## 2021-04-21 ENCOUNTER — Other Ambulatory Visit: Payer: Self-pay | Admitting: Cardiology

## 2021-05-29 LAB — TSH: TSH: 4.74 u[IU]/mL — ABNORMAL HIGH (ref 0.450–4.500)

## 2021-05-29 LAB — COMPREHENSIVE METABOLIC PANEL
ALT: 27 IU/L (ref 0–32)
AST: 32 IU/L (ref 0–40)
Albumin/Globulin Ratio: 2.1 (ref 1.2–2.2)
Albumin: 4.8 g/dL — ABNORMAL HIGH (ref 3.7–4.7)
Alkaline Phosphatase: 111 IU/L (ref 44–121)
BUN/Creatinine Ratio: 34 — ABNORMAL HIGH (ref 12–28)
BUN: 28 mg/dL — ABNORMAL HIGH (ref 8–27)
Bilirubin Total: 0.7 mg/dL (ref 0.0–1.2)
CO2: 28 mmol/L (ref 20–29)
Calcium: 10.5 mg/dL — ABNORMAL HIGH (ref 8.7–10.3)
Chloride: 99 mmol/L (ref 96–106)
Creatinine, Ser: 0.82 mg/dL (ref 0.57–1.00)
Globulin, Total: 2.3 g/dL (ref 1.5–4.5)
Glucose: 118 mg/dL — ABNORMAL HIGH (ref 70–99)
Potassium: 4.2 mmol/L (ref 3.5–5.2)
Sodium: 138 mmol/L (ref 134–144)
Total Protein: 7.1 g/dL (ref 6.0–8.5)
eGFR: 74 mL/min/{1.73_m2} (ref >59)

## 2021-05-29 LAB — LIPID PANEL
Chol/HDL Ratio: 2.8 ratio (ref 0.0–4.4)
Cholesterol, Total: 167 mg/dL (ref 100–199)
HDL: 59 mg/dL (ref >39)
LDL Chol Calc (NIH): 94 mg/dL (ref 0–99)
Triglycerides: 74 mg/dL (ref 0–149)
VLDL Cholesterol Cal: 14 mg/dL (ref 5–40)

## 2021-05-29 LAB — T4, FREE: Free T4: 1.07 ng/dL (ref 0.82–1.77)

## 2021-05-31 NOTE — Progress Notes (Deleted)
? ? ?Ashley Berg ?Date of Birth: May 31, 1945 ? ? ?History of Present Illness: ?Ashley Berg is seen back today for follow up HTN. She has a history of HTN and hyperlipidemia. She is s/p left CEA. She has a history of hypothyroidism. ? ?She does not take her blood pressure at home because it makes her feel too anxious.   ? ?On prior visit she was noted to have a chronically elevated calcium level. PTH level was mildly elevated. No history of renal stones. No fractures. Normal renal function. She has not been taking calcium supplements. She did stop her supplement of Vitamin D. We did discuss potential for parathyroidectomy but deferred.  ? ?Carotid dopplers in October 2019 showed nonobstructive disease. ? ?On follow up today she is doing very well.    She denies any palpitations, SOB, chest pain. No edema. She is taking a low dose of levothyroxine plus a thyroid supplement. She is tolerating low dose lipitor well.  ? ?Current Outpatient Medications on File Prior to Visit  ?Medication Sig Dispense Refill  ? amLODipine (NORVASC) 10 MG tablet Take 1 tablet (10 mg total) by mouth daily. 90 tablet 3  ? aspirin EC 81 MG tablet Take 1 tablet (81 mg total) by mouth daily. 30 tablet 11  ? atorvastatin (LIPITOR) 10 MG tablet Take 0.5 tablets (5 mg total) by mouth daily. 45 tablet 3  ? b complex vitamins tablet Take 1 tablet by mouth daily.     ? digoxin (LANOXIN) 0.125 MG tablet Take 1 tablet (125 mcg total) by mouth daily. 90 tablet 3  ? Glucosamine-Chondroitin-MSM (TRIPLE FLEX PO) Take 1 tablet by mouth as directed.    ? Homeopathic Products (SIMILASAN DRY EYE RELIEF) SOLN Place 1 drop into both eyes as needed (dry eyes).    ? hydrochlorothiazide (HYDRODIURIL) 25 MG tablet Take 1 tablet (25 mg total) by mouth daily. 90 tablet 3  ? irbesartan (AVAPRO) 300 MG tablet Take 1 tablet (300 mg total) by mouth daily. 90 tablet 3  ? levothyroxine (SYNTHROID) 100 MCG tablet TAKE 1/4 TABLET BY MOUTH DAILY BEFORE BREAKFAST. 45 tablet 3  ?  Magnesium 400 MG CAPS Take 1 capsule by mouth 2 (two) times daily.    ? metoprolol succinate (TOPROL-XL) 25 MG 24 hr tablet TAKE 1/2 (ONE HALF) TABLETS (12.5 MG TOTAL) BY MOUTH DAILY. 45 tablet 3  ? NON FORMULARY Primal Lab Cardio Relax AO: Take 1 tab by mouth twice a day    ? NON FORMULARY Primal MyoCor Balance: Take 1 tab by mouth twice a day    ? OVER THE COUNTER MEDICATION Take 1 tablet by mouth daily. Natural thyroid support    ? OVER THE COUNTER MEDICATION Take 1 capsule by mouth daily. polycosanol natural supplement    ? POTASSIUM PO Take by mouth.    ? Red Yeast Rice Extract (RED YEAST RICE PO) Take 1 capsule by mouth daily.    ? triamcinolone (KENALOG) 0.025 % cream Apply 1 application topically 2 (two) times daily. To chest (Patient not taking: Reported on 10/31/2020) 80 g 0  ? ?No current facility-administered medications on file prior to visit.  ? ? ?Allergies  ?Allergen Reactions  ? Broccoli [Brassica Oleracea]   ?  GI upset  ? Chicken Protein   ?  Does not eat meat  ? Milk-Related Compounds   ?  GI upset  ? Mushroom Extract Complex   ?  GI upset  ? Onion   ?  GI upset  ?  Penicillins Other (See Comments)  ?  "skin crawls"  ? ? ?Past Medical History:  ?Diagnosis Date  ? Carotid artery occlusion   ? Hyperlipidemia 08/17/2013  ? Hypertension   ? Hypothyroidism   ? Refuses conventional medicine  ? PAF (paroxysmal atrial fibrillation) (HCC)   ? Seasonal allergies   ? ? ?Past Surgical History:  ?Procedure Laterality Date  ? APPENDECTOMY    ? CAROTID ENDARTERECTOMY    ? CERVICAL POLYPECTOMY    ? DOPPLER ECHOCARDIOGRAPHY  03/02/1999  ? NORMAL LEFT VENTRICULAR SIZE. EF 55%  ? ENDARTERECTOMY Left 09/24/2012  ? Procedure: ENDARTERECTOMY CAROTID;  Surgeon: Ashley OchoaJames D Lawson, MD;  [facility];  Service: Vascular;  Laterality: Left;  ? EXTERNAL EAR SURGERY Right 1983  ? tympanoplasty with patch  ? PATCH ANGIOPLASTY Left 09/24/2012  ? Procedure: PATCH ANGIOPLASTY;  Surgeon: Ashley OchoaJames D Lawson, MD;  [facility];  Service:  Vascular;  Laterality: Left;  ? TUBAL LIGATION    ? ? ?Social History  ? ?Tobacco Use  ?Smoking Status Never  ?Smokeless Tobacco Never  ? ? ?Social History  ? ?Substance and Sexual Activity  ?Alcohol Use No  ? Alcohol/week: 0.0 standard drinks  ? ? ?Family History  ?Problem Relation Age of Onset  ? Stroke Father   ? Alzheimer's disease Mother   ? ? ?Review of Systems: ?As noted in HPI.   All other systems were reviewed and are negative. ? ?Physical Exam: ?There were no vitals taken for this visit. ?GENERAL:  Well appearing WF in NAD ?HEENT:  Normal.  ?NECK:  No jugular venous distention, carotid upstroke brisk and symmetric, no bruits, no thyromegaly or adenopathy ?LUNGS:  Clear to auscultation bilaterally ?CHEST:  Unremarkable ?HEART:  RRR,  PMI not displaced or sustained,S1 and S2 within normal limits, no S3, no S4: no clicks, no rubs, no murmurs ?ABD:  Soft, nontender. BS +, no masses or bruits. No hepatomegaly, no splenomegaly ?EXT:  2 + pulses throughout, no edema, no cyanosis no clubbing ?SKIN:  Warm and dry.  No rashes ?NEURO:  Alert and oriented x 3. Cranial nerves II through XII intact. ?PSYCH:  Cognitively intact ? ? ? ? ? ?LABORATORY DATA:  ? ? ?Lab Results  ?Component Value Date  ? WBC 4.1 01/26/2020  ? HGB 14.9 01/26/2020  ? HCT 43.7 01/26/2020  ? PLT 188 01/26/2020  ? GLUCOSE 118 (H) 05/29/2021  ? CHOL 167 05/29/2021  ? TRIG 74 05/29/2021  ? HDL 59 05/29/2021  ? LDLDIRECT 144.4 09/08/2012  ? LDLCALC 94 05/29/2021  ? ALT 27 05/29/2021  ? AST 32 05/29/2021  ? NA 138 05/29/2021  ? K 4.2 05/29/2021  ? CL 99 05/29/2021  ? CREATININE 0.82 05/29/2021  ? BUN 28 (H) 05/29/2021  ? CO2 28 05/29/2021  ? TSH 4.740 (H) 05/29/2021  ? INR 1.00 09/22/2012  ? ? ? ?Assessment / Plan: ?1. Carotid arterial disease s/p left CEA.  Continue ASA. Dopplers in October 2019 showed no significant stenosis.   ? ?2. HTN - under fair control. Continue current therapy. ? ?3. Hyperlipidemia. LDL has decreased from 103 to 98 on low dose  lipitor. We discussed increasing lipitor to 10 mg daily but she wants to wait until we check lab work again in 6 months. ? ?4. Paroxysmal AFib- remote history - no symptomatic recurrence. On digoxin. Last level 0.4.  ? ?5. Hypothyroidism on synthroid. Will repeat TFTS in 6 months.  ? ?6. Hypercalcemia. PTH consistent with mild hyperparathyroidism. Calcium  level has been stable for years and she is asymptomatic.  ? ? ?I will follow up in 6 months with lab. ? ?

## 2021-06-05 ENCOUNTER — Encounter: Payer: Self-pay | Admitting: Cardiology

## 2021-06-05 ENCOUNTER — Other Ambulatory Visit: Payer: Self-pay

## 2021-06-05 ENCOUNTER — Telehealth: Payer: Self-pay | Admitting: Cardiology

## 2021-06-05 ENCOUNTER — Ambulatory Visit: Payer: MEDICARE | Admitting: Cardiology

## 2021-06-05 DIAGNOSIS — E7800 Pure hypercholesterolemia, unspecified: Secondary | ICD-10-CM

## 2021-06-05 MED ORDER — ATORVASTATIN CALCIUM 10 MG PO TABS: 10.0000 mg | ORAL_TABLET | Freq: Every day | ORAL | 3 refills | Status: DC

## 2021-06-05 NOTE — Telephone Encounter (Signed)
Already spoke to patient see mychart note. ?

## 2021-06-05 NOTE — Telephone Encounter (Signed)
Pt c/o medication issue: ? ?1. Name of Medication: atorvastatin (LIPITOR) 10 MG tablet ? ?2. How are you currently taking this medication (dosage and times per day)? Half a tablet daily ? ?3. Are you having a reaction (difficulty breathing--STAT)? no ? ?4. What is your medication issue? Patient states she would like a call back from [place] about whether she can increase her atorvastatin from 5 mg to 10 mg daily. She is also calling for the lab results. ?

## 2021-06-05 NOTE — Telephone Encounter (Signed)
Spoke to patient Dr.Jordan advised take Atorvastatin 10 mg daily.Advised to repeat fasting lipid and hepatic panels in 3 months.Lab orders mailed. ?

## 2021-06-05 NOTE — Telephone Encounter (Signed)
Yes I would favor going ahead and increasing to 10 mg daily. Can repeat lab in 3 months ? ?Keldrick Pomplun [place] MD, [facility] ? ?

## 2021-06-28 NOTE — Progress Notes (Signed)
? ? ?Ashley Berg ?Date of Birth: [birth year] ? ? ?History of Present Illness: ?Ashley Berg is seen back today for follow up HTN. She has a history of HTN and hyperlipidemia. She is s/p left CEA in 2014. She has a history of hypothyroidism. ? ?She does not take her blood pressure at home because it makes her feel too anxious.   ? ?On prior visit she was noted to have a chronically elevated calcium level. PTH level was mildly elevated. No history of renal stones. No fractures. Normal renal function. She has not been taking calcium supplements. She did stop her supplement of Vitamin D. We did discuss potential for parathyroidectomy but deferred.  ? ?Carotid dopplers in October 2019 showed nonobstructive disease. ? ?On follow up today she is doing very well.    She denies any palpitations, SOB, chest pain. No edema. She is taking a low dose of levothyroxine plus a thyroid supplement. She is tolerating lipitor 10 mg daily. She does report a recent bout with sinus and bronchitis. This has improved.  ? ?Current Outpatient Medications on File Prior to Visit  ?Medication Sig Dispense Refill  ? amLODipine (NORVASC) 10 MG tablet Take 1 tablet (10 mg total) by mouth daily. 90 tablet 3  ? aspirin EC 81 MG tablet Take 1 tablet (81 mg total) by mouth daily. 30 tablet 11  ? atorvastatin (LIPITOR) 10 MG tablet Take 1 tablet (10 mg total) by mouth daily. 90 tablet 3  ? b complex vitamins tablet Take 1 tablet by mouth daily.     ? digoxin (LANOXIN) 0.125 MG tablet Take 1 tablet (125 mcg total) by mouth daily. 90 tablet 3  ? Glucosamine-Chondroitin-MSM (TRIPLE FLEX PO) Take 1 tablet by mouth as directed.    ? Homeopathic Products (SIMILASAN DRY EYE RELIEF) SOLN Place 1 drop into both eyes as needed (dry eyes).    ? hydrochlorothiazide (HYDRODIURIL) 25 MG tablet Take 1 tablet (25 mg total) by mouth daily. 90 tablet 3  ? irbesartan (AVAPRO) 300 MG tablet Take 1 tablet (300 mg total) by mouth daily. 90 tablet 3  ? levothyroxine (SYNTHROID) 100  MCG tablet TAKE 1/4 TABLET BY MOUTH DAILY BEFORE BREAKFAST. 45 tablet 3  ? Magnesium 400 MG CAPS Take 1 capsule by mouth 2 (two) times daily.    ? metoprolol succinate (TOPROL-XL) 25 MG 24 hr tablet TAKE 1/2 (ONE HALF) TABLETS (12.5 MG TOTAL) BY MOUTH DAILY. 45 tablet 3  ? NON FORMULARY Primal Lab Cardio Relax AO: Take 1 tab by mouth twice a day    ? NON FORMULARY Primal MyoCor Balance: Take 1 tab by mouth twice a day    ? OVER THE COUNTER MEDICATION Take 1 tablet by mouth daily. Natural thyroid support    ? OVER THE COUNTER MEDICATION Take 1 capsule by mouth daily. polycosanol natural supplement    ? POTASSIUM PO Take by mouth.    ? ?No current facility-administered medications on file prior to visit.  ? ? ?Allergies  ?Allergen Reactions  ? Molds & Smuts Anaphylaxis and Shortness Of Breath  ? Broccoli [Brassica Oleracea]   ?  GI upset  ? Chicken Protein   ?  Does not eat meat  ? Clindamycin Other (See Comments)  ?  "teeth hurt"  ? Milk-Related Compounds   ?  GI upset  ? Mushroom Extract Complex   ?  GI upset  ? Onion   ?  GI upset  ? Penicillins Other (See Comments)  ?  "  skin crawls"  ? ? ?Past Medical History:  ?Diagnosis Date  ? Carotid artery occlusion   ? Hyperlipidemia 08/17/2013  ? Hypertension   ? Hypothyroidism   ? Refuses conventional medicine  ? PAF (paroxysmal atrial fibrillation) ([place])   ? Seasonal allergies   ? ? ?Past Surgical History:  ?Procedure Laterality Date  ? APPENDECTOMY    ? CAROTID ENDARTERECTOMY    ? CERVICAL POLYPECTOMY    ? DOPPLER ECHOCARDIOGRAPHY  03/02/1999  ? NORMAL LEFT VENTRICULAR SIZE. EF 55%  ? ENDARTERECTOMY Left 09/24/2012  ? Procedure: ENDARTERECTOMY CAROTID;  Surgeon: Mal Misty, MD;  Location: [facility];  Service: Vascular;  Laterality: Left;  ? EXTERNAL EAR SURGERY Right 1983  ? tympanoplasty with patch  ? PATCH ANGIOPLASTY Left 09/24/2012  ? Procedure: PATCH ANGIOPLASTY;  Surgeon: Mal Misty, MD;  Location: [facility];  Service: Vascular;  Laterality: Left;  ? TUBAL LIGATION     ? ? ?Social History  ? ?Tobacco Use  ?Smoking Status Never  ?Smokeless Tobacco Never  ? ? ?Social History  ? ?Substance and Sexual Activity  ?Alcohol Use No  ? Alcohol/week: 0.0 standard drinks  ? ? ?Family History  ?Problem Relation Age of Onset  ? Stroke Father   ? Alzheimer's disease Mother   ? ? ?Review of Systems: ?As noted in HPI.   All other systems were reviewed and are negative. ? ?Physical Exam: ?BP 130/60 (BP Location: Left Arm)   Pulse 64   Ht 5\' 10"  (1.778 m)   Wt 144 lb 6.4 oz (65.5 kg)   SpO2 98%   BMI 20.72 kg/m?  ?GENERAL:  Well appearing WF in NAD ?HEENT:  Normal.  ?NECK:  No jugular venous distention, carotid upstroke brisk and symmetric, no bruits, no thyromegaly or adenopathy ?LUNGS:  Clear to auscultation bilaterally ?CHEST:  Unremarkable ?HEART:  RRR,  PMI not displaced or sustained,S1 and S2 within normal limits, no S3, no S4: no clicks, no rubs, no murmurs ?ABD:  Soft, nontender. BS +, no masses or bruits. No hepatomegaly, no splenomegaly ?EXT:  2 + pulses throughout, no edema, no cyanosis no clubbing ?SKIN:  Warm and dry.  No rashes ?NEURO:  Alert and oriented x 3. Cranial nerves II through XII intact. ?PSYCH:  Cognitively intact ? ? ? ? ? ?LABORATORY DATA:  ? ? ?Lab Results  ?Component Value Date  ? WBC 4.1 01/26/2020  ? HGB 14.9 01/26/2020  ? HCT 43.7 01/26/2020  ? PLT 188 01/26/2020  ? GLUCOSE 118 (H) 05/29/2021  ? CHOL 167 05/29/2021  ? TRIG 74 05/29/2021  ? HDL 59 05/29/2021  ? LDLDIRECT 144.4 09/08/2012  ? [place] 94 05/29/2021  ? ALT 27 05/29/2021  ? AST 32 05/29/2021  ? NA 138 05/29/2021  ? K 4.2 05/29/2021  ? CL 99 05/29/2021  ? CREATININE 0.82 05/29/2021  ? BUN 28 (H) 05/29/2021  ? CO2 28 05/29/2021  ? TSH 4.740 (H) 05/29/2021  ? INR 1.00 09/22/2012  ? ? ? ?Assessment / Plan: ?1. Carotid arterial disease s/p left CEA.  Continue ASA. Dopplers in October 2019 showed no significant stenosis.  We will update now. ? ?2. HTN - under excellent  control. Continue current  therapy. ? ?3. Hyperlipidemia. LDL improved to 94 on lipitor 10 mg daily. Will continue for now. Goal is < 70 but she has been reluctant to increase med further. Will work on buy in.  ? ?4. Paroxysmal AFib- remote history - no symptomatic recurrence. On digoxin. Last  level 0.4.  ? ?5. Hypothyroidism on synthroid. TFTs are OK.  ? ?6. Hypercalcemia. PTH consistent with mild hyperparathyroidism. Calcium level has been stable for years and she is asymptomatic.  ? ? ?I will follow up in 6 months  ? ?

## 2021-07-02 ENCOUNTER — Telehealth: Payer: Self-pay | Admitting: Cardiology

## 2021-07-02 NOTE — Telephone Encounter (Signed)
Pt states that she has a lingering cough and wants to know if she should reschedule her appt that she has on 4/12. Pt would like for nurse to give her a call. Please advise ?

## 2021-07-02 NOTE — Telephone Encounter (Signed)
Spoke with pt, aware okay to come to appointment, she was encouraged to wear a mask. ?

## 2021-07-04 ENCOUNTER — Encounter: Payer: Self-pay | Admitting: Cardiology

## 2021-07-04 ENCOUNTER — Ambulatory Visit: Payer: MEDICARE | Admitting: Cardiology

## 2021-07-04 ENCOUNTER — Ambulatory Visit (INDEPENDENT_AMBULATORY_CARE_PROVIDER_SITE_OTHER): Payer: MEDICARE | Admitting: Cardiology

## 2021-07-04 VITALS — BP 130/60 | HR 64 | Ht 70.0 in | Wt 144.4 lb

## 2021-07-04 DIAGNOSIS — E7800 Pure hypercholesterolemia, unspecified: Secondary | ICD-10-CM

## 2021-07-04 DIAGNOSIS — I6522 Occlusion and stenosis of left carotid artery: Secondary | ICD-10-CM | POA: Diagnosis not present

## 2021-07-04 DIAGNOSIS — E039 Hypothyroidism, unspecified: Secondary | ICD-10-CM | POA: Diagnosis not present

## 2021-07-04 DIAGNOSIS — E213 Hyperparathyroidism, unspecified: Secondary | ICD-10-CM

## 2021-07-04 DIAGNOSIS — I10 Essential (primary) hypertension: Secondary | ICD-10-CM | POA: Diagnosis not present

## 2021-07-04 NOTE — Patient Instructions (Signed)
Medication Instructions:  ?Continue same medication ?*If you need a refill on your cardiac medications before your next appointment, please call your pharmacy* ? ? ?Lab Work: ?None ordered ? ? ?Testing/Procedures: ?Carotid doppler ? ? ?Follow-Up: ?At [facility], you and your health needs are our priority.  As part of our continuing mission to provide you with exceptional heart care, we have created designated Provider Care Teams.  These Care Teams include your primary Cardiologist (physician) and Advanced Practice Providers (APPs -  Physician Assistants and Nurse Practitioners) who all work together to provide you with the care you need, when you need it. ? ?We recommend signing up for the patient portal called "MyChart".  Sign up information is provided on this After Visit Summary.  MyChart is used to connect with patients for Virtual Visits (Telemedicine).  Patients are able to view lab/test results, encounter notes, upcoming appointments, etc.  Non-urgent messages can be sent to your provider as well.   ?To learn more about what you can do with MyChart, go to [email or web address].   ? ?Your next appointment:  6 months ?  ? ?The format for your next appointment: Office ? ? ?Provider:  Dr.Jordan ? ? ?Important Information About Sugar ? ? ? ? ? ? ?

## 2021-07-04 NOTE — Addendum Note (Signed)
Addended by: Neoma Laming on: 07/04/2021 05:04 PM ? ? Modules accepted: Orders ? ?

## 2021-07-19 ENCOUNTER — Ambulatory Visit (HOSPITAL_COMMUNITY)
Admission: RE | Admit: 2021-07-19 | Discharge: 2021-07-19 | Disposition: A | Discharge status: Home / Self Care | Payer: MEDICARE | Source: Ambulatory Visit | Attending: Cardiology | Admitting: Cardiology

## 2021-07-19 ENCOUNTER — Other Ambulatory Visit: Payer: Self-pay | Admitting: Cardiology

## 2021-07-19 DIAGNOSIS — E039 Hypothyroidism, unspecified: Secondary | ICD-10-CM

## 2021-07-19 DIAGNOSIS — I6522 Occlusion and stenosis of left carotid artery: Secondary | ICD-10-CM

## 2021-07-19 DIAGNOSIS — E7800 Pure hypercholesterolemia, unspecified: Secondary | ICD-10-CM

## 2021-07-19 DIAGNOSIS — E213 Hyperparathyroidism, unspecified: Secondary | ICD-10-CM

## 2021-07-19 DIAGNOSIS — I10 Essential (primary) hypertension: Secondary | ICD-10-CM

## 2021-09-04 ENCOUNTER — Ambulatory Visit: Payer: MEDICARE | Admitting: Cardiology

## 2021-09-06 LAB — LIPID PANEL
Chol/HDL Ratio: 3.1 ratio (ref 0.0–4.4)
Cholesterol, Total: 142 mg/dL (ref 100–199)
HDL: 46 mg/dL (ref >39)
LDL Chol Calc (NIH): 81 mg/dL (ref 0–99)
Triglycerides: 76 mg/dL (ref 0–149)
VLDL Cholesterol Cal: 15 mg/dL (ref 5–40)

## 2021-09-06 LAB — HEPATIC FUNCTION PANEL
ALT: 30 IU/L (ref 0–32)
AST: 30 IU/L (ref 0–40)
Albumin: 4.8 g/dL — ABNORMAL HIGH (ref 3.7–4.7)
Alkaline Phosphatase: 110 IU/L (ref 44–121)
Bilirubin Total: 0.8 mg/dL (ref 0.0–1.2)
Bilirubin, Direct: 0.26 mg/dL (ref 0.00–0.40)
Total Protein: 6.1 g/dL (ref 6.0–8.5)

## 2021-09-27 ENCOUNTER — Other Ambulatory Visit: Payer: Self-pay | Admitting: Cardiology

## 2021-10-28 ENCOUNTER — Encounter: Payer: Self-pay | Admitting: Cardiology

## 2021-10-29 ENCOUNTER — Other Ambulatory Visit: Payer: Self-pay

## 2021-10-29 DIAGNOSIS — K30 Functional dyspepsia: Secondary | ICD-10-CM

## 2021-10-29 NOTE — Progress Notes (Signed)
Amb ref

## 2021-10-30 ENCOUNTER — Telehealth: Payer: Self-pay | Admitting: Cardiology

## 2021-10-30 NOTE — Telephone Encounter (Signed)
Called patient, advised she would like to know if Dr.Jordan has any specific providers with GI that he would recommend. This is new for her and she would want to be who he recommends.   Advised I would send a message. She would like to speak with Elnita Maxwell if possible- I did advise they were in clinic and would be a delay but she would call her as soon as they could.  Patient verbalized understanding.

## 2021-10-30 NOTE — Telephone Encounter (Signed)
Had recommended [place]/[facility] referral  Sami Roes [place] MD, [facility]

## 2021-10-30 NOTE — Telephone Encounter (Signed)
Patient is calling about a MyChart message she sent.

## 2021-10-30 NOTE — Telephone Encounter (Signed)
Spoke to patient Dr.Jordan advised GI Drs.with [facility].Advised order has been placed and a scheduler will be calling back with appointment.

## 2021-11-02 ENCOUNTER — Encounter: Payer: Self-pay | Admitting: Gastroenterology

## 2021-11-02 NOTE — Telephone Encounter (Signed)
Spoke to patient appointment scheduled with [facility] Dr.Henry Danis 9/13 at 10:20 am.

## 2021-11-02 NOTE — Telephone Encounter (Signed)
   Pt is calling back, she said, [facility] has not call her yet. She would like to speak with Elnita Maxwell

## 2021-12-01 ENCOUNTER — Other Ambulatory Visit: Payer: Self-pay | Admitting: Cardiology

## 2021-12-05 ENCOUNTER — Encounter: Payer: Self-pay | Admitting: Gastroenterology

## 2021-12-05 ENCOUNTER — Other Ambulatory Visit (INDEPENDENT_AMBULATORY_CARE_PROVIDER_SITE_OTHER): Payer: MEDICARE

## 2021-12-05 ENCOUNTER — Telehealth: Payer: Self-pay | Admitting: Gastroenterology

## 2021-12-05 ENCOUNTER — Ambulatory Visit (INDEPENDENT_AMBULATORY_CARE_PROVIDER_SITE_OTHER): Payer: MEDICARE | Admitting: Gastroenterology

## 2021-12-05 VITALS — BP 124/66 | HR 58 | Ht 70.0 in | Wt 134.2 lb

## 2021-12-05 DIAGNOSIS — R1031 Right lower quadrant pain: Secondary | ICD-10-CM

## 2021-12-05 DIAGNOSIS — R140 Abdominal distension (gaseous): Secondary | ICD-10-CM

## 2021-12-05 DIAGNOSIS — R634 Abnormal weight loss: Secondary | ICD-10-CM

## 2021-12-05 DIAGNOSIS — I6522 Occlusion and stenosis of left carotid artery: Secondary | ICD-10-CM | POA: Diagnosis not present

## 2021-12-05 LAB — CBC WITH DIFFERENTIAL/PLATELET
Basophils Absolute: 0 10*3/uL (ref 0.0–0.1)
Basophils Relative: 0.7 % (ref 0.0–3.0)
Eosinophils Absolute: 0.1 10*3/uL (ref 0.0–0.7)
Eosinophils Relative: 1.9 % (ref 0.0–5.0)
HCT: 38.8 % (ref 36.0–46.0)
Hemoglobin: 13.2 g/dL (ref 12.0–15.0)
Lymphocytes Relative: 20.9 % (ref 12.0–46.0)
Lymphs Abs: 1.1 10*3/uL (ref 0.7–4.0)
MCHC: 34.1 g/dL (ref 30.0–36.0)
MCV: 97.8 fl (ref 78.0–100.0)
Monocytes Absolute: 0.6 10*3/uL (ref 0.1–1.0)
Monocytes Relative: 12.3 % — ABNORMAL HIGH (ref 3.0–12.0)
Neutro Abs: 3.3 10*3/uL (ref 1.4–7.7)
Neutrophils Relative %: 64.2 % (ref 43.0–77.0)
Platelets: 141 10*3/uL — ABNORMAL LOW (ref 150.0–400.0)
RBC: 3.97 Mil/uL (ref 3.87–5.11)
RDW: 14.9 % (ref 11.5–15.5)
WBC: 5.2 10*3/uL (ref 4.0–10.5)

## 2021-12-05 LAB — COMPREHENSIVE METABOLIC PANEL
ALT: 25 U/L (ref 0–35)
AST: 34 U/L (ref 0–37)
Albumin: 4.4 g/dL (ref 3.5–5.2)
Alkaline Phosphatase: 85 U/L (ref 39–117)
BUN: 19 mg/dL (ref 6–23)
CO2: 31 mEq/L (ref 19–32)
Calcium: 10.8 mg/dL — ABNORMAL HIGH (ref 8.4–10.5)
Chloride: 102 mEq/L (ref 96–112)
Creatinine, Ser: 0.73 mg/dL (ref 0.40–1.20)
GFR: 79.57 mL/min (ref >60.00)
Glucose, Bld: 107 mg/dL — ABNORMAL HIGH (ref 70–99)
Potassium: 4 mEq/L (ref 3.5–5.1)
Sodium: 139 mEq/L (ref 135–145)
Total Bilirubin: 1.3 mg/dL — ABNORMAL HIGH (ref 0.2–1.2)
Total Protein: 7.2 g/dL (ref 6.0–8.3)

## 2021-12-05 LAB — TSH: TSH: 3.43 u[IU]/mL (ref 0.35–5.50)

## 2021-12-05 LAB — T4, FREE: Free T4: 0.91 ng/dL (ref 0.60–1.60)

## 2021-12-05 NOTE — Telephone Encounter (Signed)
Patient has been schedule for EGD/Colon 01/01/22

## 2021-12-05 NOTE — Patient Instructions (Signed)
_______________________________________________________  If you are age 77 or older, your body mass index should be between 23-30. Your Body mass index is 19.26 kg/m. If this is out of the aforementioned range listed, please consider follow up with your Primary Care Provider.  If you are age 30 or younger, your body mass index should be between 19-25. Your Body mass index is 19.26 kg/m. If this is out of the aformentioned range listed, please consider follow up with your Primary Care Provider.   ________________________________________________________  The  GI providers would like to encourage you to use [facility] to communicate with providers for non-urgent requests or questions.  Due to long hold times on the telephone, sending your provider a message by [facility] may be a faster and more efficient way to get a response.  Please allow 48 business hours for a response.  Please remember that this is for non-urgent requests.  _______________________________________________________  Your provider has requested that you go to the basement level for lab work before leaving today. Press "B" on the elevator. The lab is located at the first door on the left as you exit the elevator.  It has been recommended to you by your physician that you have a(n) EGD/Colon completed. Per your request, we did not schedule the procedure(s) today. Please contact our office at [phone] should you decide to have the procedure completed. You will be scheduled for a pre-visit and procedure at that time.  Due to recent changes in healthcare laws, you may see the results of your imaging and laboratory studies on MyChart before your provider has had a chance to review them.  We understand that in some cases there may be results that are confusing or concerning to you. Not all laboratory results come back in the same time frame and the provider may be waiting for multiple results in order to interpret others.  Please give  [place] 48 hours in order for your provider to thoroughly review all the results before contacting the office for clarification of your results.    It was a pleasure to see you today!  Thank you for trusting me with your gastrointestinal care!

## 2021-12-05 NOTE — Progress Notes (Signed)
[facility] Consult Note:  History: Ashley Berg 12/05/2021  Referring provider: Peter Swaz[place], MD  Reason for consult/chief complaint: Abdominal Pain (Pt reports she has abdominal discomfort in right lower abdomen. Discomfort and abdominal swelling starts after she eats. Pt has tried to eliminate certain foods to help discomfort and abd bloating. ), Weight Loss (Pt also reports she has lost 10 pounds since the beginning of August), Gastroesophageal Reflux, and Diarrhea (Pt states frequency of diarrhea varies )   Subjective  HPI: Ashley Berg was referred to [place] by her Cardiologist Dr. Swaziland for "indigestion".  She describes lifelong tendencies to belching bloating and gas requiring a restrictive diet and use of various "food enzymes" and probiotics.  Things are changed in the last month or 2 where she has had some right lower quadrant pain with visible distention in that area.  Bowel habits have also been of variable frequency and consistency, but she has had more loose stools in the last month but no bleeding.  She has also noticed about a 10 pound weight loss in the last several months.  Ashley Berg denies dysphagia, nausea or vomiting. As near she can recall, she has had no previous screening colonoscopy, but thinks perhaps she had a stool test of some kind.  She has not seen a primary care doctor in some time, and typically consults with her cardiologist over most issues.  ROS:  Review of Systems  Constitutional:  Negative for appetite change and unexpected weight change.  HENT:  Negative for mouth sores and voice change.   Eyes:  Negative for pain and redness.  Respiratory:  Negative for cough and shortness of breath.   Cardiovascular:  Negative for chest pain and palpitations.  Genitourinary:  Negative for dysuria and hematuria.  Musculoskeletal:  Negative for arthralgias and myalgias.  Skin:  Negative for pallor and rash.  Neurological:  Negative for weakness and  headaches.  Hematological:  Negative for adenopathy.     Past Medical History: Past Medical History:  Diagnosis Date   Carotid artery occlusion    Hyperlipidemia 08/17/2013   Hypertension    Hypothyroidism    Refuses conventional medicine   PAF (paroxysmal atrial fibrillation) (HCC)    Seasonal allergies    Last Cardiology office note April 2023: "1. Carotid arterial disease s/p left CEA.  Continue ASA. Dopplers in October 2019 showed no significant stenosis.  We will update now.   2. HTN - under excellent  control. Continue current therapy.   3. Hyperlipidemia. LDL improved to 94 on lipitor 10 mg daily. Will continue for now. Goal is < 70 but she has been reluctant to increase med further. Will work on buy in.    4. Paroxysmal AFib- remote history - no symptomatic recurrence. On digoxin. Last level 0.4.    5. Hypothyroidism on synthroid. TFTs are OK.    6. Hypercalcemia. PTH consistent with mild hyperparathyroidism. Calcium level has been stable for years and she is asymptomatic.      I will follow up in 6 months"   Past Surgical History: Past Surgical History:  Procedure Laterality Date   APPENDECTOMY     CAROTID ENDARTERECTOMY     CERVICAL POLYPECTOMY     DOPPLER ECHOCARDIOGRAPHY  03/02/1999   NORMAL LEFT VENTRICULAR SIZE. EF 55%   ENDARTERECTOMY Left 09/24/2012   Procedure: ENDARTERECTOMY CAROTID;  Surgeon: Pryor Ochoa, MD;  Location: [facility] OR;  Service: Vascular;  Laterality: Left;   EXTERNAL EAR SURGERY Right 1983   tympanoplasty  with patch   PATCH ANGIOPLASTY Left 09/24/2012   Procedure: PATCH ANGIOPLASTY;  Surgeon: Pryor Ochoa, MD;  Location: [facility] OR;  Service: Vascular;  Laterality: Left;   TUBAL LIGATION       Family History: Family History  Problem Relation Age of Onset   Alzheimer's disease Mother    Stroke Father    Colon cancer Neg Hx    Esophageal cancer Neg Hx    Liver cancer Neg Hx    Pancreatic cancer Neg Hx    Rectal cancer Neg Hx    Stomach  cancer Neg Hx     Social History: Social History   Socioeconomic History   Marital status: Widowed    Spouse name: Not on file   Number of children: Not on file   Years of education: Not on file   Highest education level: Not on file  Occupational History   Not on file  Tobacco Use   Smoking status: Never   Smokeless tobacco: Never  Vaping Use   Vaping Use: Never used  Substance and Sexual Activity   Alcohol use: No    Alcohol/week: 0.0 standard drinks of alcohol   Drug use: No   Sexual activity: Not on file  Other Topics Concern   Not on file  Social History Narrative   Not on file   Social Determinants of Health   Financial Resource Strain: Not on file  Food Insecurity: Not on file  Transportation Needs: Not on file  Physical Activity: Not on file  Stress: Not on file  Social Connections: Not on file    Allergies: Allergies  Allergen Reactions   Molds & Smuts Anaphylaxis and Shortness Of Breath   Broccoli [Brassica Oleracea]     GI upset   Chicken Protein     Does not eat meat   Clindamycin Other (See Comments)    "teeth hurt"   Milk-Related Compounds     GI upset   Mushroom Extract Complex     GI upset   Onion     GI upset   Penicillins Other (See Comments)    "skin crawls"    Outpatient Meds: Current Outpatient Medications  Medication Sig Dispense Refill   amLODipine (NORVASC) 10 MG tablet TAKE 1 TABLET BY MOUTH EVERY DAY 90 tablet 1   aspirin EC 81 MG tablet Take 1 tablet (81 mg total) by mouth daily. 30 tablet 11   b complex vitamins tablet Take 1 tablet by mouth daily.      digoxin (LANOXIN) 0.125 MG tablet Take 1 tablet (125 mcg total) by mouth daily. 90 tablet 3   Glucosamine-Chondroitin-MSM (TRIPLE FLEX PO) Take 1 tablet by mouth as directed.     Homeopathic Products (SIMILASAN DRY EYE RELIEF) SOLN Place 1 drop into both eyes as needed (dry eyes).     hydrochlorothiazide (HYDRODIURIL) 25 MG tablet TAKE 1 TABLET BY MOUTH EVERY DAY 90 tablet  1   irbesartan (AVAPRO) 300 MG tablet TAKE 1 TABLET BY MOUTH EVERY DAY 90 tablet 3   levothyroxine (SYNTHROID) 100 MCG tablet TAKE 1/4 TABLET BY MOUTH DAILY BEFORE BREAKFAST. 22 tablet 16   Magnesium 400 MG CAPS Take 1 capsule by mouth 2 (two) times daily.     metoprolol succinate (TOPROL-XL) 25 MG 24 hr tablet TAKE 1/2 (ONE HALF) TABLETS (12.5 MG TOTAL) BY MOUTH DAILY. 45 tablet 3   NON FORMULARY Primal Lab Cardio Relax AO: Take 1 tab by mouth twice a day  NON FORMULARY Primal MyoCor Balance: Take 1 tab by mouth twice a day     OVER THE COUNTER MEDICATION Take 1 tablet by mouth daily. Natural thyroid support     OVER THE COUNTER MEDICATION Take 1 capsule by mouth daily. polycosanol natural supplement     POTASSIUM PO Take by mouth.     atorvastatin (LIPITOR) 10 MG tablet Take 1 tablet (10 mg total) by mouth daily. 90 tablet 3   No current facility-administered medications for this visit.      ___________________________________________________________________ Objective   Exam:  BP 124/66 (BP Location: Right Arm, Patient Position: Sitting, Cuff Size: Normal)   Pulse (!) 58   Ht 5\' 10"  (1.778 m)   Wt 134 lb 3.2 oz (60.9 kg)   SpO2 99%   BMI 19.26 kg/m  Wt Readings from Last 3 Encounters:  12/05/21 134 lb 3.2 oz (60.9 kg)  07/04/21 144 lb 6.4 oz (65.5 kg)  10/31/20 146 lb 6.4 oz (66.4 kg)  Weight loss noted as above  General: Well-appearing Eyes: sclera anicteric, no redness ENT: oral mucosa moist without lesions, no cervical or supraclavicular lymphadenopathy CV: Regular without appreciable murmur, no JVD, no peripheral edema Resp: clear to auscultation bilaterally, normal RR and effort noted GI: soft, no tenderness, with active bowel sounds. No guarding or palpable organomegaly noted.  No distention, no mass felt, no bruit Skin; warm and dry, no rash or jaundice noted Neuro: awake, alert and oriented x 3. Normal gross motor function and fluent speech  Labs:      Latest Ref Rng & Units 09/06/2021    8:29 AM 05/29/2021    8:22 AM 05/03/2020   10:09 AM  CMP  Glucose 70 - 99 mg/dL  07/01/2020    BUN 8 - 27 mg/dL  28    Creatinine 710 - 1.00 mg/dL  6.26    Sodium 9.48 - 546 mmol/L  138    Potassium 3.5 - 5.2 mmol/L  4.2    Chloride 96 - 106 mmol/L  99    CO2 20 - 29 mmol/L  28    Calcium 8.7 - 10.3 mg/dL  270    Total Protein 6.0 - 8.5 g/dL 6.1  7.1  6.8   Total Bilirubin 0.0 - 1.2 mg/dL 0.8  0.7  0.7   Alkaline Phos 44 - 121 IU/L 110  111  104   AST 0 - 40 IU/L 30  32  33   ALT 0 - 32 IU/L 30  27  31     Mildly elevated TSH with Normal Free T4  March 2023  Radiologic Studies:  None  Assessment: Encounter Diagnoses  Name Primary?   RLQ abdominal pain Yes   Abdominal bloating    Weight loss     It sounds like she has had decades of maldigestion with food intolerances causing tendencies to bloating and gas.  However, in the last couple of months a change in bowel habits with more frequent loose stool and weight loss.  It is not clear if she has ever had colorectal cancer screening.  She does not have clear risk factors for bacterial overgrowth, has never been tested for celiac sprue, and is older than usual age for IBD onset.  Nevertheless, always conditions must be considered.  Plan:  Colonoscopy and upper endoscopy.  She was agreeable after thorough discussion of procedure and risks, however needed to check with her family on their availability to be her care appointment.  Some potential open  dates were given to her.  The benefits and risks of the planned procedure were described in detail with the patient or (when appropriate) their health care proxy.  Risks were outlined as including, but not limited to, bleeding, infection, perforation, adverse medication reaction leading to cardiac or pulmonary decompensation, pancreatitis (if ERCP).  The limitation of incomplete mucosal visualization was also discussed.  No guarantees or warranties were  given.  Labs today: CBC, CMP, TSH and free T4, celiac antibody  Thank you for the courtesy of this consult.  Please call me with any questions or concerns.  Charlie Pitter III  CC: Referring provider noted above

## 2021-12-06 ENCOUNTER — Encounter: Payer: Self-pay | Admitting: Gastroenterology

## 2021-12-06 LAB — IGA: Immunoglobulin A: 202 mg/dL (ref 70–320)

## 2021-12-06 LAB — TISSUE TRANSGLUTAMINASE, IGA: (tTG) Ab, IgA: <1 U/mL

## 2021-12-06 NOTE — Telephone Encounter (Signed)
Instruction have been mailed and sent with mychart in Epic.

## 2021-12-07 ENCOUNTER — Other Ambulatory Visit: Payer: Self-pay

## 2021-12-07 DIAGNOSIS — R140 Abdominal distension (gaseous): Secondary | ICD-10-CM

## 2021-12-07 DIAGNOSIS — R634 Abnormal weight loss: Secondary | ICD-10-CM

## 2021-12-07 DIAGNOSIS — R1031 Right lower quadrant pain: Secondary | ICD-10-CM

## 2021-12-07 MED ORDER — NA SULFATE-K SULFATE-MG SULF 17.5-3.13-1.6 GM/177ML PO SOLN: 1.0000 | Freq: Once | ORAL | 0 refills | Status: AC

## 2021-12-07 NOTE — Progress Notes (Signed)
Rx and amb referral have been placed

## 2021-12-14 ENCOUNTER — Encounter: Payer: Self-pay | Admitting: Gastroenterology

## 2021-12-14 ENCOUNTER — Other Ambulatory Visit: Payer: Self-pay | Admitting: Cardiology

## 2021-12-17 ENCOUNTER — Encounter: Payer: Self-pay | Admitting: Gastroenterology

## 2021-12-17 NOTE — Telephone Encounter (Signed)
[place],    Please forward this to our staff that do endoscopy prior authorization.    - HD

## 2021-12-26 NOTE — Progress Notes (Signed)
Ashley Berg Date of Birth: 03-May-1945   History of Present Illness: Silver is seen back today for follow up HTN. She has a history of HTN and hyperlipidemia. She is s/p left CEA in 2014. She has a history of hypothyroidism.  She does not take her blood pressure at home because it makes her feel too anxious.    On prior visit she was noted to have a chronically elevated calcium level. PTH level was mildly elevated. No history of renal stones. No fractures. Normal renal function. She has not been taking calcium supplements. She did stop her supplement of Vitamin D. We did discuss potential for parathyroidectomy but deferred.   Carotid dopplers in October 2019 showed nonobstructive disease.  She denies any palpitations, SOB, chest pain. No edema. She is taking a low dose of levothyroxine plus a thyroid supplement. She is tolerating lipitor 10 mg daily. She does report a 14 lb unintentional weight loss. Also having some RLQ fullness/discomfort. Has seen Dr Loletha Carrow and is having panendoscopy next week.   Current Outpatient Medications on File Prior to Visit  Medication Sig Dispense Refill   amLODipine (NORVASC) 10 MG tablet TAKE 1 TABLET BY MOUTH EVERY DAY 90 tablet 1   aspirin EC 81 MG tablet Take 1 tablet (81 mg total) by mouth daily. 30 tablet 11   b complex vitamins tablet Take 1 tablet by mouth daily.      digoxin (LANOXIN) 0.125 MG tablet TAKE 1 TABLET BY MOUTH EVERY DAY 90 tablet 2   Glucosamine-Chondroitin-MSM (TRIPLE FLEX PO) Take 1 tablet by mouth as directed.     Homeopathic Products (SIMILASAN DRY EYE RELIEF) SOLN Place 1 drop into both eyes as needed (dry eyes).     hydrochlorothiazide (HYDRODIURIL) 25 MG tablet TAKE 1 TABLET BY MOUTH EVERY DAY 90 tablet 1   irbesartan (AVAPRO) 300 MG tablet TAKE 1 TABLET BY MOUTH EVERY DAY 90 tablet 3   levothyroxine (SYNTHROID) 100 MCG tablet TAKE 1/4 TABLET BY MOUTH DAILY BEFORE BREAKFAST. 22 tablet 16   Magnesium 400 MG CAPS Take 1 capsule by  mouth 2 (two) times daily.     metoprolol succinate (TOPROL-XL) 25 MG 24 hr tablet TAKE 1/2 (ONE HALF) TABLETS (12.5 MG TOTAL) BY MOUTH DAILY. 45 tablet 3   NON FORMULARY Primal Lab Cardio Relax AO: Take 1 tab by mouth twice a day     NON FORMULARY Primal MyoCor Balance: Take 1 tab by mouth twice a day     OVER THE COUNTER MEDICATION Take 1 tablet by mouth daily. Natural thyroid support     OVER THE COUNTER MEDICATION Take 1 capsule by mouth daily. polycosanol natural supplement     POTASSIUM PO Take by mouth.     atorvastatin (LIPITOR) 10 MG tablet Take 1 tablet (10 mg total) by mouth daily. 90 tablet 3   No current facility-administered medications on file prior to visit.    Allergies  Allergen Reactions   Molds & Smuts Anaphylaxis and Shortness Of Breath   Broccoli [Brassica Oleracea]     GI upset   Chicken Protein     Does not eat meat   Clindamycin Other (See Comments)    "teeth hurt"   Milk-Related Compounds     GI upset   Mushroom Extract Complex     GI upset   Onion     GI upset   Penicillins Other (See Comments)    "skin crawls"    Past Medical History:  Diagnosis Date   Carotid artery occlusion    Hyperlipidemia 08/17/2013   Hypertension    Hypothyroidism    Refuses conventional medicine   PAF (paroxysmal atrial fibrillation) (HCC)    Seasonal allergies     Past Surgical History:  Procedure Laterality Date   APPENDECTOMY     CAROTID ENDARTERECTOMY     CERVICAL POLYPECTOMY     DOPPLER ECHOCARDIOGRAPHY  03/02/1999   NORMAL LEFT VENTRICULAR SIZE. EF 55%   ENDARTERECTOMY Left 09/24/2012   Procedure: ENDARTERECTOMY CAROTID;  Surgeon: Pryor Ochoa, MD;  Location: [facility] OR;  Service: Vascular;  Laterality: Left;   EXTERNAL EAR SURGERY Right 1983   tympanoplasty with patch   PATCH ANGIOPLASTY Left 09/24/2012   Procedure: PATCH ANGIOPLASTY;  Surgeon: Pryor Ochoa, MD;  Location: [facility] OR;  Service: Vascular;  Laterality: Left;   TUBAL LIGATION      Social History    Tobacco Use  Smoking Status Never  Smokeless Tobacco Never    Social History   Substance and Sexual Activity  Alcohol Use No   Alcohol/week: 0.0 standard drinks of alcohol    Family History  Problem Relation Age of Onset   Alzheimer's disease Mother    Stroke Father    Colon cancer Neg Hx    Esophageal cancer Neg Hx    Liver cancer Neg Hx    Pancreatic cancer Neg Hx    Rectal cancer Neg Hx    Stomach cancer Neg Hx     Review of Systems: As noted in HPI.   All other systems were reviewed and are negative.  Physical Exam: BP (!) 110/57 (BP Location: Left Arm, Patient Position: Sitting, Cuff Size: Normal)   Pulse (!) 58   Ht 5\' 10"  (1.778 m)   Wt 130 lb 6.4 oz (59.1 kg)   SpO2 99%   BMI 18.71 kg/m  GENERAL:  Well appearing WF in NAD HEENT:  Normal.  NECK:  No jugular venous distention, carotid upstroke brisk and symmetric, no bruits, no thyromegaly or adenopathy LUNGS:  Clear to auscultation bilaterally CHEST:  Unremarkable HEART:  RRR,  PMI not displaced or sustained,S1 and S2 within normal limits, no S3, no S4: no clicks, no rubs, no murmurs ABD:  Soft, nontender. BS +, no masses or bruits. No hepatomegaly, no splenomegaly EXT:  2 + pulses throughout, no edema, no cyanosis no clubbing SKIN:  Warm and dry.  No rashes NEURO:  Alert and oriented x 3. Cranial nerves II through XII intact. PSYCH:  Cognitively intact      LABORATORY DATA:    Lab Results  Component Value Date   WBC 5.2 12/05/2021   HGB 13.2 12/05/2021   HCT 38.8 12/05/2021   PLT 141.0 (L) 12/05/2021   GLUCOSE 107 (H) 12/05/2021   CHOL 142 09/06/2021   TRIG 76 09/06/2021   HDL 46 09/06/2021   LDLDIRECT 144.4 09/08/2012   LDLCALC 81 09/06/2021   ALT 25 12/05/2021   AST 34 12/05/2021   NA 139 12/05/2021   K 4.0 12/05/2021   CL 102 12/05/2021   CREATININE 0.73 12/05/2021   BUN 19 12/05/2021   CO2 31 12/05/2021   TSH 3.43 12/05/2021   INR 1.00 09/22/2012     Assessment / Plan: 1.  Carotid arterial disease s/p left CEA.  Continue ASA. Dopplers in October 2019 showed no significant stenosis.  No stenosis by doppler April 2023  2. HTN - under excellent  control. Continue current therapy.  3. Hyperlipidemia. LDL  improved to 81 mg on lipitor 10 mg daily. Will continue for now. Goal is < 70 but she has been reluctant to increase med further.   4. Paroxysmal AFib- remote history - no symptomatic recurrence. On digoxin. Last level 0.4.   5. Hypothyroidism on synthroid. TFTs are normal  6. Hypercalcemia. PTH consistent with mild hyperparathyroidism. Calcium level has been stable for years and she is asymptomatic.   7. Weight loss and RLQ abdominal pain. Await GI work up   I will follow up in 6 months

## 2021-12-28 ENCOUNTER — Encounter: Payer: Self-pay | Admitting: Cardiology

## 2021-12-28 ENCOUNTER — Ambulatory Visit: Payer: MEDICARE | Attending: Cardiology | Admitting: Cardiology

## 2021-12-28 VITALS — BP 110/57 | HR 58 | Ht 70.0 in | Wt 130.4 lb

## 2021-12-28 DIAGNOSIS — I10 Essential (primary) hypertension: Secondary | ICD-10-CM | POA: Insufficient documentation

## 2021-12-28 DIAGNOSIS — E039 Hypothyroidism, unspecified: Secondary | ICD-10-CM | POA: Diagnosis not present

## 2021-12-28 DIAGNOSIS — E7800 Pure hypercholesterolemia, unspecified: Secondary | ICD-10-CM | POA: Insufficient documentation

## 2021-12-28 DIAGNOSIS — I6522 Occlusion and stenosis of left carotid artery: Secondary | ICD-10-CM | POA: Diagnosis not present

## 2021-12-29 ENCOUNTER — Encounter: Payer: Self-pay | Admitting: Certified Registered Nurse Anesthetist

## 2022-01-01 ENCOUNTER — Ambulatory Visit (AMBULATORY_SURGERY_CENTER): Payer: MEDICARE | Admitting: Gastroenterology

## 2022-01-01 ENCOUNTER — Encounter: Payer: Self-pay | Admitting: Gastroenterology

## 2022-01-01 VITALS — BP 131/55 | HR 63 | Temp 96.6°F | Resp 15 | Ht 70.0 in | Wt 134.0 lb

## 2022-01-01 DIAGNOSIS — R1031 Right lower quadrant pain: Secondary | ICD-10-CM | POA: Diagnosis not present

## 2022-01-01 DIAGNOSIS — R140 Abdominal distension (gaseous): Secondary | ICD-10-CM

## 2022-01-01 DIAGNOSIS — R197 Diarrhea, unspecified: Secondary | ICD-10-CM

## 2022-01-01 DIAGNOSIS — K529 Noninfective gastroenteritis and colitis, unspecified: Secondary | ICD-10-CM | POA: Diagnosis not present

## 2022-01-01 DIAGNOSIS — R634 Abnormal weight loss: Secondary | ICD-10-CM

## 2022-01-01 MED ORDER — SODIUM CHLORIDE 0.9 % IV SOLN: 500.0000 mL | Freq: Once | INTRAVENOUS | Status: DC

## 2022-01-01 NOTE — Progress Notes (Signed)
Called to room to assist during endoscopic procedure.  Patient ID and intended procedure confirmed with present staff. Received instructions for my participation in the procedure from the performing physician.  

## 2022-01-01 NOTE — Patient Instructions (Signed)
Handout provided on hemorrhoids.   Continue present medications. Await pathology results.   If all biopsies are normal, will plan to schedule CT scan of chest, abdomen and pelvis.   No repeat colonoscopy for screening purposes recommended due to age, current guidelines and lack of polyps on this exam.  YOU HAD AN ENDOSCOPIC PROCEDURE TODAY AT [facility]:   Refer to the procedure report that was given to you for any specific questions about what was found during the examination.  If the procedure report does not answer your questions, please call your gastroenterologist to clarify.  If you requested that your care partner not be given the details of your procedure findings, then the procedure report has been included in a sealed envelope for you to review at your convenience later.  YOU SHOULD EXPECT: Some feelings of bloating in the abdomen. Passage of more gas than usual.  Walking can help get rid of the air that was put into your GI tract during the procedure and reduce the bloating. If you had a lower endoscopy (such as a colonoscopy or flexible sigmoidoscopy) you may notice spotting of blood in your stool or on the toilet paper. If you underwent a bowel prep for your procedure, you may not have a normal bowel movement for a few days.  Please Note:  You might notice some irritation and congestion in your nose or some drainage.  This is from the oxygen used during your procedure.  There is no need for concern and it should clear up in a day or so.  SYMPTOMS TO REPORT IMMEDIATELY:  Following lower endoscopy (colonoscopy or flexible sigmoidoscopy):  Excessive amounts of blood in the stool  Significant tenderness or worsening of abdominal pains  Swelling of the abdomen that is new, acute  Fever of 100F or higher  Following upper endoscopy (EGD)  Vomiting of blood or coffee ground material  New chest pain or pain under the shoulder blades  Painful or persistently difficult  swallowing  New shortness of breath  Fever of 100F or higher  Black, tarry-looking stools  For urgent or emergent issues, a gastroenterologist can be reached at any hour by calling [phone]. Do not use MyChart messaging for urgent concerns.    DIET:  We do recommend a small meal at first, but then you may proceed to your regular diet.  Drink plenty of fluids but you should avoid alcoholic beverages for 24 hours.  ACTIVITY:  You should plan to take it easy for the rest of today and you should NOT DRIVE or use heavy machinery until tomorrow (because of the sedation medicines used during the test).    FOLLOW UP: Our staff will call the number listed on your records the next business day following your procedure.  We will call around 7:15- 8:00 am to check on you and address any questions or concerns that you may have regarding the information given to you following your procedure. If we do not reach you, we will leave a message.     If any biopsies were taken you will be contacted by phone or by letter within the next 1-3 weeks.  Please call us at [phone] if you have not heard about the biopsies in 3 weeks.    SIGNATURES/CONFIDENTIALITY: You and/or your care partner have signed paperwork which will be entered into your electronic medical record.  These signatures attest to the fact that that the information above on your After Visit Summary has  been reviewed and is understood.  Full responsibility of the confidentiality of this discharge information lies with you and/or your care-partner.

## 2022-01-01 NOTE — Progress Notes (Signed)
1454 Ephedrine 10 mg given IV due to low BP, MD updated.

## 2022-01-01 NOTE — Progress Notes (Signed)
Pt's states no medical or surgical changes since previsit or office visit. 

## 2022-01-01 NOTE — Op Note (Signed)
[facility] Patient Name: Ashley Berg Procedure Date: 01/01/2022 2:36 PM MRN: [id number] Endoscopist: Sherilyn Cooter L. Myrtie Neither , MD Age: 76 Referring MD:  Date of Birth: Oct 26, 1945 Gender: Female Account #: [id number] Procedure:                Colonoscopy Indications:              Abdominal pain in the right lower quadrant, Chronic                            diarrhea, Weight loss Medicines:                Monitored Anesthesia Care Procedure:                Pre-Anesthesia Assessment:                           - Prior to the procedure, a History and Physical                            was performed, and patient medications and                            allergies were reviewed. The patient's tolerance of                            previous anesthesia was also reviewed. The risks                            and benefits of the procedure and the sedation                            options and risks were discussed with the patient.                            All questions were answered, and informed consent                            was obtained. Prior Anticoagulants: The patient has                            taken no previous anticoagulant or antiplatelet                            agents. ASA Grade Assessment: III - A patient with                            severe systemic disease. After reviewing the risks                            and benefits, the patient was deemed in                            satisfactory condition to undergo the procedure.  After obtaining informed consent, the colonoscope                            was passed under direct vision. Throughout the                            procedure, the patient's blood pressure, pulse, and                            oxygen saturations were monitored continuously. The                            Olympus PCF-H190DL [id number]) Colonoscope was                            introduced through the anus and  advanced to the the                            terminal ileum, with identification of the                            appendiceal orifice and IC valve. The colonoscopy                            was somewhat difficult due to a redundant colon.                            Successful completion of the procedure was aided by                            using manual pressure and straightening and                            shortening the scope to obtain bowel loop                            reduction. The patient tolerated the procedure                            well. The quality of the bowel preparation was                            excellent. The terminal ileum, ileocecal valve,                            appendiceal orifice, and rectum were photographed. Scope In: 2:42:46 PM Scope Out: 3:00:25 PM Scope Withdrawal Time: 0 hours 10 minutes 36 seconds  Total Procedure Duration: 0 hours 17 minutes 39 seconds  Findings:                 The digital rectal exam findings include decreased                            sphincter tone.  The terminal ileum appeared normal.                           Normal mucosa was found in the entire colon.                            Biopsies for histology were taken with a cold                            forceps from the right colon and left colon for                            evaluation of microscopic colitis.                           Internal hemorrhoids were found. The hemorrhoids                            were small.                           The exam was otherwise without abnormality on                            direct and retroflexion views. Complications:            No immediate complications. Estimated Blood Loss:     Estimated blood loss was minimal. Impression:               - Decreased sphincter tone found on digital rectal                            exam.                           - The examined portion of the ileum was  normal.                           - Normal mucosa in the entire examined colon.                            Biopsied.                           - Internal hemorrhoids.                           - The examination was otherwise normal on direct                            and retroflexion views. Recommendation:           - Patient has a contact number available for                            emergencies. The signs and symptoms of potential  delayed complications were discussed with the                            patient. Return to normal activities tomorrow.                            Written discharge instructions were provided to the                            patient.                           - Resume previous diet.                           - Continue present medications.                           - Await pathology results.                           - No repeat colonoscopy for CRC screening purposes                            recommended due to age, current guidelines and lack                            of polyps on this exam.                           - See the other procedure note for documentation of                            additional recommendations. Ashley Berg L. Ashley Carrow, MD 01/01/2022 3:13:46 PM This report has been signed electronically.

## 2022-01-01 NOTE — Op Note (Signed)
[facility] Patient Name: Ashley Berg Procedure Date: 01/01/2022 2:34 PM MRN: [id number] Endoscopist: Sherilyn Cooter L. Myrtie Neither , MD Age: 76 Referring MD:  Date of Birth: 1946-02-09 Gender: Female Account #: [id number] Procedure:                Upper GI endoscopy Indications:              Diarrhea (acute on chronic), Weight loss                           recent normal CBC, CMP, TSH, Celiac Ab Medicines:                Monitored Anesthesia Care Procedure:                Pre-Anesthesia Assessment:                           - Prior to the procedure, a History and Physical                            was performed, and patient medications and                            allergies were reviewed. The patient's tolerance of                            previous anesthesia was also reviewed. The risks                            and benefits of the procedure and the sedation                            options and risks were discussed with the patient.                            All questions were answered, and informed consent                            was obtained. Prior Anticoagulants: The patient has                            taken no previous anticoagulant or antiplatelet                            agents. ASA Grade Assessment: III - A patient with                            severe systemic disease. After reviewing the risks                            and benefits, the patient was deemed in                            satisfactory condition to undergo the procedure.  After obtaining informed consent, the endoscope was                            passed under direct vision. Throughout the                            procedure, the patient's blood pressure, pulse, and                            oxygen saturations were monitored continuously. The                            Endoscope was introduced through the mouth, and                            advanced to the second  part of duodenum. The upper                            GI endoscopy was accomplished without difficulty.                            The patient tolerated the procedure well. Scope In: Scope Out: Findings:                 The esophagus was normal.                           The stomach was normal.                           The cardia and gastric fundus were normal on                            retroflexion.                           The examined duodenum was normal. Biopsies for                            histology were taken with a cold forceps for                            evaluation of celiac disease. Complications:            No immediate complications. Estimated Blood Loss:     Estimated blood loss was minimal. Impression:               - Normal esophagus.                           - Normal stomach.                           - Normal examined duodenum. Biopsied. Recommendation:           - Patient has a contact number available for  emergencies. The signs and symptoms of potential                            delayed complications were discussed with the                            patient. Return to normal activities tomorrow.                            Written discharge instructions were provided to the                            patient.                           - Resume previous diet.                           - Continue present medications.                           - Await pathology results.                           - See the other procedure note for documentation of                            additional recommendations.                           - If all biopsies normal, will plan to schedule                            CTscan chest,abdomen and pelvis Sherilyn Cooter L. Myrtie Neither, MD 01/01/2022 3:16:40 PM This report has been signed electronically.

## 2022-01-01 NOTE — Progress Notes (Signed)
1456 Ephedrine 10 mg given IV due to low BP, MD updated.

## 2022-01-01 NOTE — Progress Notes (Signed)
1436 Robinul 0.1 mg IV given due large amount of secretions upon assessment.  MD made aware, vss 

## 2022-01-01 NOTE — Progress Notes (Signed)
Report given to PACU, vss 

## 2022-01-01 NOTE — Progress Notes (Signed)
No changes to clinical history since GI office visit on 12/05/21.  The patient is appropriate for an endoscopic procedure in the ambulatory setting.  - [name], MD    

## 2022-01-02 ENCOUNTER — Telehealth: Payer: Self-pay | Admitting: *Deleted

## 2022-01-02 NOTE — Telephone Encounter (Signed)
Attempted f/u phone call. No answer. Left message. °

## 2022-01-14 ENCOUNTER — Telehealth: Payer: Self-pay | Admitting: Gastroenterology

## 2022-01-14 DIAGNOSIS — R1031 Right lower quadrant pain: Secondary | ICD-10-CM

## 2022-01-14 DIAGNOSIS — R197 Diarrhea, unspecified: Secondary | ICD-10-CM

## 2022-01-14 DIAGNOSIS — R634 Abnormal weight loss: Secondary | ICD-10-CM

## 2022-01-14 NOTE — Telephone Encounter (Signed)
CT order in epic. Called and spoke with patient. I advised that she can call radiology scheduling and they should be able to see the order now. Pt verbalized understanding.

## 2022-01-14 NOTE — Telephone Encounter (Signed)
Pt called into the office to clarify instructions for CT scan. Pt states that she was not informed where to pick up contrast and when to drink. I told pt that she is arriving at 1:30 pm for her appt and will drink at [facility]. Pt knows to enter through main entrance of [facility] and she will be escorted to CT department once she is checked in. Pt knows to be NPO 4 hours prior. Pt verbalized understanding and had no concerns at the end of the call.

## 2022-01-16 ENCOUNTER — Other Ambulatory Visit (HOSPITAL_COMMUNITY): Payer: MEDICARE

## 2022-01-28 ENCOUNTER — Ambulatory Visit (HOSPITAL_COMMUNITY)
Admission: RE | Admit: 2022-01-28 | Discharge: 2022-01-28 | Disposition: A | Discharge status: Home / Self Care | Payer: MEDICARE | Source: Ambulatory Visit | Attending: Gastroenterology | Admitting: Gastroenterology

## 2022-01-28 DIAGNOSIS — R634 Abnormal weight loss: Secondary | ICD-10-CM | POA: Diagnosis present

## 2022-01-28 DIAGNOSIS — R1031 Right lower quadrant pain: Secondary | ICD-10-CM | POA: Diagnosis present

## 2022-01-28 DIAGNOSIS — R197 Diarrhea, unspecified: Secondary | ICD-10-CM | POA: Insufficient documentation

## 2022-01-28 MED ORDER — SODIUM CHLORIDE (PF) 0.9 % IJ SOLN
INTRAMUSCULAR | Status: AC
  Filled 2022-01-28: qty 50

## 2022-01-28 MED ORDER — IOHEXOL 300 MG/ML  SOLN
100.0000 mL | Freq: Once | INTRAMUSCULAR | Status: AC | PRN
  Administered 2022-01-28: 100 mL via INTRAVENOUS

## 2022-02-11 ENCOUNTER — Other Ambulatory Visit: Payer: Self-pay | Admitting: Cardiology

## 2022-02-28 ENCOUNTER — Ambulatory Visit: Payer: MEDICARE | Admitting: Gastroenterology

## 2022-03-05 ENCOUNTER — Encounter: Payer: Self-pay | Admitting: Gastroenterology

## 2022-03-05 ENCOUNTER — Ambulatory Visit (INDEPENDENT_AMBULATORY_CARE_PROVIDER_SITE_OTHER): Payer: MEDICARE | Admitting: Gastroenterology

## 2022-03-05 ENCOUNTER — Other Ambulatory Visit: Payer: MEDICARE

## 2022-03-05 VITALS — BP 122/65 | HR 68 | Ht 70.0 in | Wt 132.0 lb

## 2022-03-05 DIAGNOSIS — K599 Functional intestinal disorder, unspecified: Secondary | ICD-10-CM

## 2022-03-05 DIAGNOSIS — R143 Flatulence: Secondary | ICD-10-CM

## 2022-03-05 DIAGNOSIS — K529 Noninfective gastroenteritis and colitis, unspecified: Secondary | ICD-10-CM | POA: Diagnosis not present

## 2022-03-05 DIAGNOSIS — I6522 Occlusion and stenosis of left carotid artery: Secondary | ICD-10-CM | POA: Diagnosis not present

## 2022-03-05 DIAGNOSIS — R140 Abdominal distension (gaseous): Secondary | ICD-10-CM

## 2022-03-05 NOTE — Patient Instructions (Signed)
_______________________________________________________  If you are age 77 or older, your body mass index should be between 23-30. Your Body mass index is 18.94 kg/m. If this is out of the aforementioned range listed, please consider follow up with your Primary Care Provider.  If you are age 61 or younger, your body mass index should be between 19-25. Your Body mass index is 18.94 kg/m. If this is out of the aformentioned range listed, please consider follow up with your Primary Care Provider.   ________________________________________________________  The [facility] providers would like to encourage you to use [facility] to communicate with providers for non-urgent requests or questions.  Due to long hold times on the telephone, sending your provider a message by [facility] may be a faster and more efficient way to get a response.  Please allow 48 business hours for a response.  Please remember that this is for non-urgent requests.  _______________________________________________________  Your provider has requested that you go to the basement level for lab work before leaving today. Press "B" on the elevator. The lab is located at the first door on the left as you exit the elevator.   Due to recent changes in healthcare laws, you may see the results of your imaging and laboratory studies on MyChart before your provider has had a chance to review them.  We understand that in some cases there may be results that are confusing or concerning to you. Not all laboratory results come back in the same time frame and the provider may be waiting for multiple results in order to interpret others.  Please give [place] 48 hours in order for your provider to thoroughly review all the results before contacting the office for clarification of your results.   It was a pleasure to see you today!  Thank you for trusting me with your gastrointestinal care!

## 2022-03-05 NOTE — Progress Notes (Signed)
[place] [facility] Progress Note  Chief Complaint: Bloating and gas  Subjective  History: From my office consult note 12/05/2021: "It sounds like she has had decades of maldigestion with food intolerances causing tendencies to bloating and gas. However, in the last couple of months a change in bowel habits with more frequent loose stool and weight loss. It is not clear if she has ever had colorectal cancer screening. She does not have clear risk factors for bacterial overgrowth, has never been tested for celiac sprue, and is older than usual age for IBD onset " Lab reports as below EGD to the duodenum normal, colonoscopy to TI normal (duodenal and colon biopsies taken normal results see below) Subsequent unrevealing CTAP. ____________________   Ashley Berg has similar symptoms to before, with bloating, gas and loose stool.  As near as I can tell, on days when she has diarrhea, it is 1 large loose stool.  Other times stool is formed.  The previously described right lower quadrant pain and distention has subsided.  She takes multiple digestive enzymes and natural remedies for gas.  She has also been taking probiotics for many years.  ROS: Cardiovascular:  no chest pain Respiratory: no dyspnea  The patient's Past Medical, Family and Social History were reviewed and are on file in the EMR.  Objective:  Med list reviewed  Current Outpatient Medications:    amLODipine (NORVASC) 10 MG tablet, TAKE 1 TABLET BY MOUTH EVERY DAY, Disp: 90 tablet, Rfl: 3   aspirin EC 81 MG tablet, Take 1 tablet (81 mg total) by mouth daily., Disp: 30 tablet, Rfl: 11   b complex vitamins tablet, Take 1 tablet by mouth daily. , Disp: , Rfl:    digoxin (LANOXIN) 0.125 MG tablet, TAKE 1 TABLET BY MOUTH EVERY DAY, Disp: 90 tablet, Rfl: 2   Glucosamine-Chondroitin-MSM (TRIPLE FLEX PO), Take 1 tablet by mouth as directed., Disp: , Rfl:    Homeopathic Products (SIMILASAN DRY EYE RELIEF) SOLN, Place 1 drop into both eyes as  needed (dry eyes)., Disp: , Rfl:    hydrochlorothiazide (HYDRODIURIL) 25 MG tablet, TAKE 1 TABLET BY MOUTH EVERY DAY, Disp: 90 tablet, Rfl: 1   irbesartan (AVAPRO) 300 MG tablet, TAKE 1 TABLET BY MOUTH EVERY DAY, Disp: 90 tablet, Rfl: 3   levothyroxine (SYNTHROID) 100 MCG tablet, TAKE 1/4 TABLET BY MOUTH DAILY BEFORE BREAKFAST., Disp: 23 tablet, Rfl: 7   Magnesium 400 MG CAPS, Take 1 capsule by mouth 2 (two) times daily., Disp: , Rfl:    NON FORMULARY, Primal Lab Cardio Relax AO: Take 1 tab by mouth twice a day, Disp: , Rfl:    NON FORMULARY, Primal MyoCor Balance: Take 1 tab by mouth twice a day, Disp: , Rfl:    OVER THE COUNTER MEDICATION, Take 1 tablet by mouth daily. Natural thyroid support, Disp: , Rfl:    OVER THE COUNTER MEDICATION, Take 1 capsule by mouth daily. polycosanol natural supplement, Disp: , Rfl:    POTASSIUM PO, Take by mouth., Disp: , Rfl:    atorvastatin (LIPITOR) 10 MG tablet, Take 1 tablet (10 mg total) by mouth daily. (Patient taking differently: Take 10 mg by mouth daily. Pt taking a 1/2 a day), Disp: 90 tablet, Rfl: 3   metoprolol succinate (TOPROL-XL) 25 MG 24 hr tablet, TAKE 1/2 (ONE HALF) TABLETS (12.5 MG TOTAL) BY MOUTH DAILY. (Patient not taking: Reported on 03/05/2022), Disp: 45 tablet, Rfl: 3   Vital signs in last 24 hrs: Vitals:   03/05/22  1342  BP: 122/65  Pulse: 68   Wt Readings from Last 3 Encounters:  03/05/22 132 lb (59.9 kg)  01/01/22 134 lb (60.8 kg)  12/28/21 130 lb 6.4 oz (59.1 kg)    Physical Exam  Looks well  Cardiac: Regular without murmur,  no peripheral edema Pulm: clear to auscultation bilaterally, normal RR and effort noted Abdomen: soft, no tenderness, with active bowel sounds. No guarding or palpable hepatosplenomegaly.  No bruit  Labs:     Latest Ref Rng & Units 12/05/2021   11:18 AM 01/26/2020   11:33 AM 07/28/2017   10:46 AM  CBC  WBC 4.0 - 10.5 K/uL 5.2  4.1  5.3   Hemoglobin 12.0 - 15.0 g/dL 19.5  09.3  26.7   Hematocrit  36.0 - 46.0 % 38.8  43.7  42.9   Platelets 150.0 - 400.0 K/uL 141.0  188  208       Latest Ref Rng & Units 12/05/2021   11:18 AM 09/06/2021    8:29 AM 05/29/2021    8:22 AM  CMP  Glucose 70 - 99 mg/dL 124   580   BUN 6 - 23 mg/dL 19   28   Creatinine 9.98 - 1.20 mg/dL 3.38   2.50   Sodium 539 - 145 mEq/L 139   138   Potassium 3.5 - 5.1 mEq/L 4.0   4.2   Chloride 96 - 112 mEq/L 102   99   CO2 19 - 32 mEq/L 31   28   Calcium 8.4 - 10.5 mg/dL 76.7   34.1   Total Protein 6.0 - 8.3 g/dL 7.2  6.1  7.1   Total Bilirubin 0.2 - 1.2 mg/dL 1.3  0.8  0.7   Alkaline Phos 39 - 117 U/L 85  110  111   AST 0 - 37 U/L 34  30  32   ALT 0 - 35 U/L 25  30  27     Lab Results  Component Value Date   TSH 3.43 12/05/2021   IgA TTG antibody negative, normal IgA level __________________________________________ Radiologic studies:  CLINICAL DATA:  Diarrhea, right lower quadrant pain, weight loss.   EXAM: CT ABDOMEN AND PELVIS WITH CONTRAST   TECHNIQUE: Multidetector CT imaging of the abdomen and pelvis was performed using the standard protocol following bolus administration of intravenous contrast.   RADIATION DOSE REDUCTION: This exam was performed according to the departmental dose-optimization program which includes automated exposure control, adjustment of the mA and/or kV according to patient size and/or use of iterative reconstruction technique.   CONTRAST:  12/07/2021 OMNIPAQUE IOHEXOL 300 MG/ML  SOLN   COMPARISON:  None Available.   FINDINGS: Lower chest: Lung bases are clear. Heart size normal. No pericardial or pleural effusion. Distal esophagus is grossly unremarkable.   Hepatobiliary: Liver is slightly enlarged, 18.4 cm. Liver and gallbladder are otherwise unremarkable. No biliary ductal dilatation.   Pancreas: Negative.   Spleen: Enlarged, 17.3 cm.   Adrenals/Urinary Tract: Adrenal glands and right kidney are unremarkable. 2.6 cm cyst in the lower pole left kidney. No  specific follow-up necessary. Ureters are decompressed. Bladder is relatively low in volume.   Stomach/Bowel: Stomach, small bowel and colon are unremarkable. Appendectomy.   Vascular/Lymphatic: Atherosclerotic calcification of the aorta. No pathologically enlarged lymph nodes.   Reproductive: Uterus is visualized.  No adnexal mass.   Other: No free fluid.  Mesenteries and peritoneum are unremarkable.   Musculoskeletal: Degenerative changes in the spine. No worrisome lytic or sclerotic  lesions.   IMPRESSION: 1. No findings to explain the patient's clinical history. 2. Hepatosplenomegaly. 3.  Aortic atherosclerosis (ICD10-I70.0).     Electronically Signed   By: Leanna Battles M.D.   On: 01/30/2022 14:11   ____________________________________________ Other: Diagnosis 1. Surgical [P], random colon biopsies BENIGN COLONIC MUCOSA WITH NO DIAGNOSTIC ABNORMALITY 2. Surgical [P], duodenum biopsies BENIGN DUODENAL MUCOSA WITH NO DIAGNOSTIC ABNORMALITY  _____________________________________________ Assessment & Plan  Assessment: Encounter Diagnoses  Name Primary?   Abdominal bloating Yes   Flatulence    Maldigestion syndrome    Chronic diarrhea     I suspect this is still maldigestion, an issue she has been dealing with for decades.  I did recommend she stop probiotics since we do not have good data on what if any digestive conditions they may improve, and the side effect of them tends to be bloating and gas.  No clear risk factors for EPI, stool for elastase to be sure  No clear risk factors for SIBO.  I did describe that diagnosis, the limited available testing that is not always accurate, and the possibility that sometimes we treat empirically with rifaximin (if affordable due to its cost).  Plan: Check fecal elastase.  Probiotics  If no improvement with the above, consider a course of rifaximin.   Charlie Pitter III

## 2022-03-12 LAB — PANCREATIC ELASTASE, FECAL: Pancreatic Elastase-1, Stool: >500 mcg/g

## 2022-03-24 ENCOUNTER — Other Ambulatory Visit: Payer: Self-pay | Admitting: Cardiology

## 2022-05-09 ENCOUNTER — Other Ambulatory Visit: Payer: Self-pay | Admitting: Cardiology

## 2022-08-07 NOTE — Progress Notes (Signed)
Ashley Berg Date of Birth: 1945/03/26   History of Present Illness: Ashley Berg is seen back today for follow up HTN. She has a history of HTN and hyperlipidemia. She is s/p left CEA in 2014. She has a history of hypothyroidism.  On prior visit she was noted to have a chronically elevated calcium level. PTH level was mildly elevated. No history of renal stones. No fractures. Normal renal function. She has not been taking calcium supplements. She did stop her supplement of Vitamin D. We did discuss potential for parathyroidectomy but deferred.   She denies any palpitations, SOB, chest pain. No edema. S She is tolerating lipitor 10 mg daily. She does report a 14 lb unintentional weight loss this past year. Also having some RLQ fullness/discomfort. Has seen Dr Myrtie Neither and had normal panendoscopy. CT abdomen was negative. Noted to have some aortic calcification. Carotid dopplers one year ago were negative. She states in November she felt overmedicated and fatigued. Stopped taking Toprol XL. Cut irbesartan in half. Felt better afterwards.   Current Outpatient Medications on File Prior to Visit  Medication Sig Dispense Refill   amLODipine (NORVASC) 10 MG tablet TAKE 1 TABLET BY MOUTH EVERY DAY 90 tablet 3   aspirin EC 81 MG tablet Take 1 tablet (81 mg total) by mouth daily. 30 tablet 11   atorvastatin (LIPITOR) 10 MG tablet TAKE 1/2 TABLET BY MOUTH DAILY 45 tablet 10   b complex vitamins tablet Take 1 tablet by mouth daily.      digoxin (LANOXIN) 0.125 MG tablet TAKE 1 TABLET BY MOUTH EVERY DAY 90 tablet 2   Glucosamine-Chondroitin-MSM (TRIPLE FLEX PO) Take 1 tablet by mouth as directed.     Homeopathic Products (SIMILASAN DRY EYE RELIEF) SOLN Place 1 drop into both eyes as needed (dry eyes).     hydrochlorothiazide (HYDRODIURIL) 25 MG tablet TAKE 1 TABLET BY MOUTH EVERY DAY 90 tablet 3   irbesartan (AVAPRO) 300 MG tablet TAKE 1 TABLET BY MOUTH EVERY DAY 90 tablet 3   levothyroxine (SYNTHROID) 100 MCG  tablet TAKE 1/4 TABLET BY MOUTH DAILY BEFORE BREAKFAST. 23 tablet 7   Magnesium 400 MG CAPS Take 1 capsule by mouth 2 (two) times daily.     OVER THE COUNTER MEDICATION Take 1 tablet by mouth daily. Natural thyroid support     OVER THE COUNTER MEDICATION Take 1 capsule by mouth daily. polycosanol natural supplement     POTASSIUM PO Take by mouth.     NON FORMULARY Primal Lab Cardio Relax AO: Take 1 tab by mouth twice a day (Patient not taking: Reported on 08/13/2022)     NON FORMULARY Primal MyoCor Balance: Take 1 tab by mouth twice a day (Patient not taking: Reported on 08/13/2022)     No current facility-administered medications on file prior to visit.    Allergies  Allergen Reactions   Molds & Smuts Anaphylaxis and Shortness Of Breath   Broccoli [Brassica Oleracea]     GI upset   Chicken Protein     Does not eat meat   Clindamycin Other (See Comments)    "teeth hurt"   Milk-Related Compounds     GI upset   Mushroom Extract Complex     GI upset   Onion     GI upset   Penicillins Other (See Comments)    "skin crawls"    Past Medical History:  Diagnosis Date   Carotid artery occlusion    Hyperlipidemia 08/17/2013   Hypertension  Hypothyroidism    Refuses conventional medicine   PAF (paroxysmal atrial fibrillation) (HCC)    Seasonal allergies     Past Surgical History:  Procedure Laterality Date   APPENDECTOMY     CAROTID ENDARTERECTOMY     CERVICAL POLYPECTOMY     DOPPLER ECHOCARDIOGRAPHY  03/02/1999   NORMAL LEFT VENTRICULAR SIZE. EF 55%   ENDARTERECTOMY Left 09/24/2012   Procedure: ENDARTERECTOMY CAROTID;  Surgeon: Pryor Ochoa, MD;  Location: [facility] OR;  Service: Vascular;  Laterality: Left;   EXTERNAL EAR SURGERY Right 1983   tympanoplasty with patch   PATCH ANGIOPLASTY Left 09/24/2012   Procedure: PATCH ANGIOPLASTY;  Surgeon: Pryor Ochoa, MD;  Location: [facility] OR;  Service: Vascular;  Laterality: Left;   TUBAL LIGATION      Social History   Tobacco Use  Smoking  Status Never  Smokeless Tobacco Never    Social History   Substance and Sexual Activity  Alcohol Use No   Alcohol/week: 0.0 standard drinks of alcohol    Family History  Problem Relation Age of Onset   Alzheimer's disease Mother    Stroke Father    Colon cancer Neg Hx    Esophageal cancer Neg Hx    Liver cancer Neg Hx    Pancreatic cancer Neg Hx    Rectal cancer Neg Hx    Stomach cancer Neg Hx     Review of Systems: As noted in HPI.   All other systems were reviewed and are negative.  Physical Exam: BP (!) 146/64 (BP Location: Left Arm, Patient Position: Sitting, Cuff Size: Normal)   Pulse 60   Ht 5\' 10"  (1.778 m)   Wt 134 lb 12.8 oz (61.1 kg)   SpO2 100%   BMI 19.34 kg/m  GENERAL:  Well appearing WF in NAD HEENT:  Normal.  NECK:  No jugular venous distention, carotid upstroke brisk and symmetric, no bruits, no thyromegaly or adenopathy LUNGS:  Clear to auscultation bilaterally CHEST:  Unremarkable HEART:  RRR,  PMI not displaced or sustained,S1 and S2 within normal limits, no S3, no S4: no clicks, no rubs, no murmurs ABD:  Soft, nontender. BS +, no masses or bruits. No hepatomegaly, no splenomegaly EXT:  2 + pulses throughout, no edema, no cyanosis no clubbing SKIN:  Warm and dry.  No rashes NEURO:  Alert and oriented x 3. Cranial nerves II through XII intact. PSYCH:  Cognitively intact      LABORATORY DATA:    Lab Results  Component Value Date   WBC 5.2 12/05/2021   HGB 13.2 12/05/2021   HCT 38.8 12/05/2021   PLT 141.0 (L) 12/05/2021   GLUCOSE 107 (H) 12/05/2021   CHOL 142 09/06/2021   TRIG 76 09/06/2021   HDL 46 09/06/2021   LDLDIRECT 144.4 09/08/2012   LDLCALC 81 09/06/2021   ALT 25 12/05/2021   AST 34 12/05/2021   NA 139 12/05/2021   K 4.0 12/05/2021   CL 102 12/05/2021   CREATININE 0.73 12/05/2021   BUN 19 12/05/2021   CO2 31 12/05/2021   TSH 3.43 12/05/2021   INR 1.00 09/22/2012   Ecg today shows NSR rate 60. LAFB. I have personally  reviewed and interpreted this study.   Assessment / Plan: 1. Carotid arterial disease s/p left CEA.  Continue ASA. Dopplers in October 2019 showed no significant stenosis.  No stenosis by doppler June 2023  2. HTN - under excellent  control. Continue current therapy.  3. Hyperlipidemia. LDL improved to 81 mg  on lipitor 10 mg daily. Will continue for now. Goal is < 70 but she has been reluctant to increase med further. Will update labs today.   4. Paroxysmal AFib- remote history - no symptomatic recurrence. On digoxin. Check level. Off Toprol now.  5. Hypothyroidism on synthroid. TFTs are normal  6. Hypercalcemia. PTH consistent with mild hyperparathyroidism. Calcium level has been stable for years and she is asymptomatic.   7. Weight loss and RLQ abdominal pain. GI evaluation negative.    I will follow up in 6 months

## 2022-08-13 ENCOUNTER — Ambulatory Visit: Payer: MEDICARE | Attending: Cardiology | Admitting: Cardiology

## 2022-08-13 ENCOUNTER — Encounter: Payer: Self-pay | Admitting: Cardiology

## 2022-08-13 VITALS — BP 146/64 | HR 60 | Ht 70.0 in | Wt 134.8 lb

## 2022-08-13 DIAGNOSIS — I10 Essential (primary) hypertension: Secondary | ICD-10-CM | POA: Diagnosis present

## 2022-08-13 DIAGNOSIS — I6522 Occlusion and stenosis of left carotid artery: Secondary | ICD-10-CM

## 2022-08-13 DIAGNOSIS — E7800 Pure hypercholesterolemia, unspecified: Secondary | ICD-10-CM | POA: Insufficient documentation

## 2022-08-13 NOTE — Patient Instructions (Signed)
Medication Instructions:  Continue same medications *If you need a refill on your cardiac medications before your next appointment, please call your pharmacy*   Lab Work: Cmet,lipid panel,digoxin level today   Testing/Procedures: None ordered   Follow-Up: At [facility], you and your health needs are our priority.  As part of our continuing mission to provide you with exceptional heart care, we have created designated Provider Care Teams.  These Care Teams include your primary Cardiologist (physician) and Advanced Practice Providers (APPs -  Physician Assistants and Nurse Practitioners) who all work together to provide you with the care you need, when you need it.  We recommend signing up for the patient portal called "MyChart".  Sign up information is provided on this After Visit Summary.  MyChart is used to connect with patients for Virtual Visits (Telemedicine).  Patients are able to view lab/test results, encounter notes, upcoming appointments, etc.  Non-urgent messages can be sent to your provider as well.   To learn more about what you can do with MyChart, go to [email or web address].    Your next appointment:  6 months    Provider:  Dr.Jordan

## 2022-08-14 LAB — COMPREHENSIVE METABOLIC PANEL
ALT: 31 IU/L (ref 0–32)
AST: 35 IU/L (ref 0–40)
Albumin/Globulin Ratio: 2 (ref 1.2–2.2)
Albumin: 4.8 g/dL (ref 3.8–4.8)
Alkaline Phosphatase: 108 IU/L (ref 44–121)
BUN/Creatinine Ratio: 34 — ABNORMAL HIGH (ref 12–28)
BUN: 26 mg/dL (ref 8–27)
Bilirubin Total: 0.7 mg/dL (ref 0.0–1.2)
CO2: 27 mmol/L (ref 20–29)
Calcium: 10.6 mg/dL — ABNORMAL HIGH (ref 8.7–10.3)
Chloride: 101 mmol/L (ref 96–106)
Creatinine, Ser: 0.77 mg/dL (ref 0.57–1.00)
Globulin, Total: 2.4 g/dL (ref 1.5–4.5)
Glucose: 92 mg/dL (ref 70–99)
Potassium: 3.9 mmol/L (ref 3.5–5.2)
Sodium: 141 mmol/L (ref 134–144)
Total Protein: 7.2 g/dL (ref 6.0–8.5)
eGFR: 79 mL/min/{1.73_m2} (ref >59)

## 2022-08-14 LAB — LIPID PANEL
Chol/HDL Ratio: 2.2 ratio (ref 0.0–4.4)
Cholesterol, Total: 151 mg/dL (ref 100–199)
HDL: 68 mg/dL (ref >39)
LDL Chol Calc (NIH): 72 mg/dL (ref 0–99)
Triglycerides: 54 mg/dL (ref 0–149)
VLDL Cholesterol Cal: 11 mg/dL (ref 5–40)

## 2022-08-14 LAB — DIGOXIN LEVEL: Digoxin, Serum: 0.6 ng/mL (ref 0.5–0.9)

## 2022-08-15 ENCOUNTER — Encounter: Payer: Self-pay | Admitting: Cardiology

## 2022-09-09 ENCOUNTER — Other Ambulatory Visit: Payer: Self-pay | Admitting: Cardiology

## 2022-09-10 ENCOUNTER — Telehealth: Payer: Self-pay | Admitting: Cardiology

## 2022-09-10 MED ORDER — ATORVASTATIN CALCIUM 10 MG PO TABS: 10.0000 mg | ORAL_TABLET | Freq: Every day | ORAL | 3 refills | Status: DC

## 2022-09-10 NOTE — Telephone Encounter (Signed)
Pt c/o medication issue:  1. Name of Medication:   atorvastatin (LIPITOR) 10 MG tablet   2. How are you currently taking this medication (dosage and times per day)?  Taking 1 tablet by mouth daily  3. Are you having a reaction (difficulty breathing--STAT)?   No  4. What is your medication issue?   Patient stated she has been taking 1 tablet a day of this medication and her last prescription still says 1/2 tablet by mouth daily.  Patient wants call back from RN Elnita Maxwell to clarify prescription.

## 2022-09-10 NOTE — Telephone Encounter (Signed)
Called patient. She states RX is incorrect.  She has been taking Atorvastatin 10mg  1 WHOLE tablet. Not half tablet as noted.  She did take 5 mg at the start because she was unsure of taking the medication, but then she "was OK with it".  She states she called the office to notify of the change last year. .  She has been on the full 10 mg since almost the original start date.  Updated med list and sent RX in with full dosage and refills.

## 2022-09-12 NOTE — Telephone Encounter (Signed)
Patient aware physician was being notified.  RX was sent when called.  Nothing further needed at this time

## 2022-12-05 ENCOUNTER — Other Ambulatory Visit: Payer: Self-pay | Admitting: Cardiology

## 2022-12-19 ENCOUNTER — Telehealth: Payer: Self-pay | Admitting: Cardiology

## 2022-12-19 MED ORDER — IRBESARTAN 300 MG PO TABS: 300.0000 mg | ORAL_TABLET | Freq: Every day | ORAL | 0 refills | Status: DC

## 2022-12-19 MED ORDER — LEVOTHYROXINE SODIUM 100 MCG PO TABS: ORAL_TABLET | ORAL | 0 refills | Status: DC

## 2022-12-19 MED ORDER — AMLODIPINE BESYLATE 10 MG PO TABS: 10.0000 mg | ORAL_TABLET | Freq: Every day | ORAL | 0 refills | Status: DC

## 2022-12-19 MED ORDER — ATORVASTATIN CALCIUM 10 MG PO TABS: 10.0000 mg | ORAL_TABLET | Freq: Every day | ORAL | 0 refills | Status: DC

## 2022-12-19 MED ORDER — HYDROCHLOROTHIAZIDE 25 MG PO TABS: 25.0000 mg | ORAL_TABLET | Freq: Every day | ORAL | 0 refills | Status: DC

## 2022-12-19 MED ORDER — DIGOXIN 125 MCG PO TABS: 125.0000 ug | ORAL_TABLET | Freq: Every day | ORAL | 0 refills | Status: DC

## 2022-12-19 NOTE — Telephone Encounter (Signed)
*  STAT* If patient is at the pharmacy, call can be transferred to refill team.   1. Which medications need to be refilled? (please list name of each medication and dose if known)   NEW PHARMACY, PT PREVIOUS PHARMACY CLOSED DOWN, SO SHE NEEDS ALL MEDS SENT TO NEW ONE BELOW   hydrochlorothiazide (HYDRODIURIL) 25 MG tablet Take 1 tablet (25 mg total) by mouth daily. Pt must keep upcoming appt in Dec for further refills   irbesartan (AVAPRO) 300 MG tablet Take 1 tablet (300 mg total) by mouth daily. Pt must keep upcoming appt in Dec for further refills   levothyroxine (SYNTHROID) 100 MCG tablet TAKE 1/4 TABLET BY MOUTH DAILY BEFORE BREAKFAST. - Pt must keep upcoming appt in Dec for further refills            amLODipine (NORVASC) 10 MG tablet Take 1 tablet (10 mg total) by mouth daily. Pt must keep upcoming appt in Dec for further refills    atorvastatin (LIPITOR) 10 MG tablet Take 1 tablet (10 mg total) by mouth daily. Pt must keep upcoming appt in Dec for further refills    digoxin (LANOXIN) 0.125 MG tablet Take 1 tablet (125 mcg total) by mouth daily. Pt must keep upcoming appt in Dec for further refills    4. Which pharmacy/location (including street and city if local pharmacy) is medication to be sent to? [id number] - [place], [place] - [address] AT [facility] [address] [facility] [address] Phone: [phone]  Fax: [phone]       5. Do they need a 30 day or 90 day supply? 90

## 2022-12-19 NOTE — Telephone Encounter (Signed)
Pt's medications were resent to pt's preferred pharmacy. Medication was sent to wrong pharmacy. Confirmation received.

## 2023-02-19 NOTE — Progress Notes (Signed)
Ashley Berg Date of Birth: May 22, 1945   History of Present Illness: Ashley Berg is seen back today for follow up HTN. She has a history of HTN and hyperlipidemia. She is s/p left CEA in 2014. She has a history of hypothyroidism.  On prior visit she was noted to have a chronically elevated calcium level. PTH level was mildly elevated. No history of renal stones. No fractures. Normal renal function. She has not been taking calcium supplements. She did stop her supplement of Vitamin D. We did discuss potential for parathyroidectomy but deferred.   She denies any palpitations, SOB, Chest pain. No edema.  She is tolerating medications well.   Current Outpatient Medications on File Prior to Visit  Medication Sig Dispense Refill   aspirin EC 81 MG tablet Take 1 tablet (81 mg total) by mouth daily. 30 tablet 11   b complex vitamins tablet Take 1 tablet by mouth daily.      Glucosamine-Chondroitin-MSM (TRIPLE FLEX PO) Take 1 tablet by mouth as directed.     Homeopathic Products (SIMILASAN DRY EYE RELIEF) SOLN Place 1 drop into both eyes as needed (dry eyes).     Magnesium 400 MG CAPS Take 1 capsule by mouth 2 (two) times daily.     NON FORMULARY Primal Lab Cardio Relax AO: Take 1 tab by mouth twice a day     NON FORMULARY Primal MyoCor Balance: Take 1 tab by mouth twice a day     OVER THE COUNTER MEDICATION Take 1 tablet by mouth daily. Natural thyroid support     OVER THE COUNTER MEDICATION Take 1 capsule by mouth daily. polycosanol natural supplement     POTASSIUM PO Take by mouth.     No current facility-administered medications on file prior to visit.    Allergies  Allergen Reactions   Molds & Smuts Anaphylaxis and Shortness Of Breath   Broccoli [Brassica Oleracea]     GI upset   Chicken Protein     Does not eat meat   Clindamycin Other (See Comments)    "teeth hurt"   Milk-Related Compounds     GI upset   Mushroom Extract Complex     GI upset   Onion     GI upset   Penicillins  Other (See Comments)    "skin crawls"    Past Medical History:  Diagnosis Date   Carotid artery occlusion    Hyperlipidemia 08/17/2013   Hypertension    Hypothyroidism    Refuses conventional medicine   PAF (paroxysmal atrial fibrillation) (HCC)    Seasonal allergies     Past Surgical History:  Procedure Laterality Date   APPENDECTOMY     CAROTID ENDARTERECTOMY     CERVICAL POLYPECTOMY     DOPPLER ECHOCARDIOGRAPHY  03/02/1999   NORMAL LEFT VENTRICULAR SIZE. EF 55%   ENDARTERECTOMY Left 09/24/2012   Procedure: ENDARTERECTOMY CAROTID;  Surgeon: Pryor Ochoa, MD;  Location: [facility] OR;  Service: Vascular;  Laterality: Left;   EXTERNAL EAR SURGERY Right 1983   tympanoplasty with patch   PATCH ANGIOPLASTY Left 09/24/2012   Procedure: PATCH ANGIOPLASTY;  Surgeon: Pryor Ochoa, MD;  Location: [facility] OR;  Service: Vascular;  Laterality: Left;   TUBAL LIGATION      Social History   Tobacco Use  Smoking Status Never  Smokeless Tobacco Never    Social History   Substance and Sexual Activity  Alcohol Use No   Alcohol/week: 0.0 standard drinks of alcohol    Family  History  Problem Relation Age of Onset   Alzheimer's disease Mother    Stroke Father    Colon cancer Neg Hx    Esophageal cancer Neg Hx    Liver cancer Neg Hx    Pancreatic cancer Neg Hx    Rectal cancer Neg Hx    Stomach cancer Neg Hx     Review of Systems: As noted in HPI.   All other systems were reviewed and are negative.  Physical Exam: BP 130/64 (BP Location: Left Arm, Cuff Size: Normal)   Pulse 66   Ht 5\' 10"  (1.778 m)   Wt 135 lb (61.2 kg)   SpO2 99%   BMI 19.37 kg/m  GENERAL:  Well appearing WF in NAD HEENT:  Normal.  NECK:  No jugular venous distention, carotid upstroke brisk and symmetric, no bruits, no thyromegaly or adenopathy LUNGS:  Clear to auscultation bilaterally CHEST:  Unremarkable HEART:  RRR,  PMI not displaced or sustained,S1 and S2 within normal limits, no S3, no S4: no clicks, no  rubs, no murmurs ABD:  Soft, nontender. BS +, no masses or bruits. No hepatomegaly, no splenomegaly EXT:  2 + pulses throughout, no edema, no cyanosis no clubbing SKIN:  Warm and dry.  No rashes NEURO:  Alert and oriented x 3. Cranial nerves II through XII intact. PSYCH:  Cognitively intact      LABORATORY DATA:    Lab Results  Component Value Date   WBC 5.2 12/05/2021   HGB 13.2 12/05/2021   HCT 38.8 12/05/2021   PLT 141.0 (L) 12/05/2021   GLUCOSE 92 08/13/2022   CHOL 151 08/13/2022   TRIG 54 08/13/2022   HDL 68 08/13/2022   LDLDIRECT 144.4 09/08/2012   LDLCALC 72 08/13/2022   ALT 31 08/13/2022   AST 35 08/13/2022   NA 141 08/13/2022   K 3.9 08/13/2022   CL 101 08/13/2022   CREATININE 0.77 08/13/2022   BUN 26 08/13/2022   CO2 27 08/13/2022   TSH 3.43 12/05/2021   INR 1.00 09/22/2012     Assessment / Plan: 1. Carotid arterial disease s/p left CEA.  Continue ASA. Dopplers in October 2019 showed no significant stenosis.  No stenosis by doppler June 2023  2. HTN - under good control. Continue current therapy.   3. Hyperlipidemia. LDL 72. Continue current therapy  4. Paroxysmal AFib- remote history - no symptomatic recurrence. On digoxin. Level ok in May.  Off Toprol now.  5. Hypothyroidism on synthroid. TFTs are normal  6. Hypercalcemia. PTH consistent with mild hyperparathyroidism. Calcium level has been stable for years and she is asymptomatic.   Meds refilled today  I will follow up in 6 months with lab work

## 2023-02-25 ENCOUNTER — Encounter: Payer: Self-pay | Admitting: Cardiology

## 2023-02-25 ENCOUNTER — Ambulatory Visit: Payer: MEDICARE | Attending: Cardiology | Admitting: Cardiology

## 2023-02-25 VITALS — BP 130/64 | HR 66 | Ht 70.0 in | Wt 135.0 lb

## 2023-02-25 DIAGNOSIS — E7800 Pure hypercholesterolemia, unspecified: Secondary | ICD-10-CM | POA: Insufficient documentation

## 2023-02-25 DIAGNOSIS — E213 Hyperparathyroidism, unspecified: Secondary | ICD-10-CM | POA: Insufficient documentation

## 2023-02-25 DIAGNOSIS — E039 Hypothyroidism, unspecified: Secondary | ICD-10-CM | POA: Insufficient documentation

## 2023-02-25 DIAGNOSIS — I10 Essential (primary) hypertension: Secondary | ICD-10-CM | POA: Insufficient documentation

## 2023-02-25 MED ORDER — IRBESARTAN 300 MG PO TABS: 300.0000 mg | ORAL_TABLET | Freq: Every day | ORAL | 3 refills | Status: AC

## 2023-02-25 MED ORDER — ATORVASTATIN CALCIUM 10 MG PO TABS: 10.0000 mg | ORAL_TABLET | Freq: Every day | ORAL | 3 refills | Status: DC

## 2023-02-25 MED ORDER — AMLODIPINE BESYLATE 10 MG PO TABS: 10.0000 mg | ORAL_TABLET | Freq: Every day | ORAL | 3 refills | Status: DC

## 2023-02-25 MED ORDER — LEVOTHYROXINE SODIUM 100 MCG PO TABS: ORAL_TABLET | ORAL | 3 refills | Status: DC

## 2023-02-25 MED ORDER — HYDROCHLOROTHIAZIDE 25 MG PO TABS: 25.0000 mg | ORAL_TABLET | Freq: Every day | ORAL | 3 refills | Status: DC

## 2023-02-25 MED ORDER — DIGOXIN 125 MCG PO TABS: 125.0000 ug | ORAL_TABLET | Freq: Every day | ORAL | 3 refills | Status: DC

## 2023-02-25 NOTE — Addendum Note (Signed)
Addended by: Neoma Laming on: 02/25/2023 03:46 PM   Modules accepted: Orders

## 2023-02-25 NOTE — Patient Instructions (Signed)
Medication Instructions:  Continue same medications *If you need a refill on your cardiac medications before your next appointment, please call your pharmacy*   Lab Work: Cbc,cmet,lipid panel,tsh,free t4,digoxin level to be done in 6 months   Testing/Procedures: None ordered   Follow-Up: At [facility], you and your health needs are our priority.  As part of our continuing mission to provide you with exceptional heart care, we have created designated Provider Care Teams.  These Care Teams include your primary Cardiologist (physician) and Advanced Practice Providers (APPs -  Physician Assistants and Nurse Practitioners) who all work together to provide you with the care you need, when you need it.  We recommend signing up for the patient portal called "MyChart".  Sign up information is provided on this After Visit Summary.  MyChart is used to connect with patients for Virtual Visits (Telemedicine).  Patients are able to view lab/test results, encounter notes, upcoming appointments, etc.  Non-urgent messages can be sent to your provider as well.   To learn more about what you can do with MyChart, go to [email or web address].    Your next appointment:  6 months    Call in March to schedule June appointment     Provider:  Dr.Jordan

## 2023-03-25 ENCOUNTER — Telehealth: Payer: Self-pay | Admitting: *Deleted

## 2023-03-25 ENCOUNTER — Telehealth: Payer: Self-pay | Admitting: Cardiology

## 2023-03-25 NOTE — Telephone Encounter (Signed)
Pt is changing pharmacy's and wants to discuss with a nurse about getting her scripts switched over. I have updated pharmacy in chart

## 2023-03-25 NOTE — Telephone Encounter (Signed)
Called patient and pharmacy updated in patient chart and verified with patient. Per the patient pharmacy of choice [facility] [address]. Patient voiced an understanding

## 2023-03-27 ENCOUNTER — Encounter: Payer: Self-pay | Admitting: Cardiology

## 2023-04-23 ENCOUNTER — Other Ambulatory Visit: Payer: Self-pay | Admitting: Cardiology

## 2023-04-25 ENCOUNTER — Other Ambulatory Visit: Payer: Self-pay | Admitting: Cardiology

## 2023-04-27 ENCOUNTER — Encounter: Payer: Self-pay | Admitting: Cardiology

## 2023-04-28 MED ORDER — HYDROCHLOROTHIAZIDE 25 MG PO TABS: 25.0000 mg | ORAL_TABLET | Freq: Every day | ORAL | 3 refills | Status: DC

## 2023-05-02 ENCOUNTER — Other Ambulatory Visit: Payer: Self-pay

## 2023-05-02 MED ORDER — ATORVASTATIN CALCIUM 10 MG PO TABS: 10.0000 mg | ORAL_TABLET | Freq: Every day | ORAL | 3 refills | Status: DC

## 2023-05-02 MED ORDER — LEVOTHYROXINE SODIUM 100 MCG PO TABS: ORAL_TABLET | ORAL | 3 refills | Status: DC

## 2023-05-02 MED ORDER — AMLODIPINE BESYLATE 10 MG PO TABS: 10.0000 mg | ORAL_TABLET | Freq: Every day | ORAL | 3 refills | Status: DC

## 2023-05-02 MED ORDER — DIGOXIN 125 MCG PO TABS: 125.0000 ug | ORAL_TABLET | Freq: Every day | ORAL | 3 refills | Status: DC

## 2023-09-01 DIAGNOSIS — E7800 Pure hypercholesterolemia, unspecified: Secondary | ICD-10-CM | POA: Diagnosis not present

## 2023-09-01 DIAGNOSIS — I10 Essential (primary) hypertension: Secondary | ICD-10-CM | POA: Diagnosis not present

## 2023-09-01 DIAGNOSIS — E039 Hypothyroidism, unspecified: Secondary | ICD-10-CM | POA: Diagnosis not present

## 2023-09-01 DIAGNOSIS — E213 Hyperparathyroidism, unspecified: Secondary | ICD-10-CM | POA: Diagnosis not present

## 2023-09-02 ENCOUNTER — Ambulatory Visit: Payer: Self-pay | Admitting: *Deleted

## 2023-09-02 LAB — CBC WITH DIFFERENTIAL/PLATELET
Basophils Absolute: 0 10*3/uL (ref 0.0–0.2)
Basos: 1 %
EOS (ABSOLUTE): 0.2 10*3/uL (ref 0.0–0.4)
Eos: 3 %
Hematocrit: 45.9 % (ref 34.0–46.6)
Hemoglobin: 15.4 g/dL (ref 11.1–15.9)
Immature Grans (Abs): 0 10*3/uL (ref 0.0–0.1)
Immature Granulocytes: 0 %
Lymphocytes Absolute: 1.5 10*3/uL (ref 0.7–3.1)
Lymphs: 27 %
MCH: 32.9 pg (ref 26.6–33.0)
MCHC: 33.6 g/dL (ref 31.5–35.7)
MCV: 98 fL — ABNORMAL HIGH (ref 79–97)
Monocytes Absolute: 0.6 10*3/uL (ref 0.1–0.9)
Monocytes: 11 %
Neutrophils Absolute: 3.2 10*3/uL (ref 1.4–7.0)
Neutrophils: 58 %
Platelets: 204 10*3/uL (ref 150–450)
RBC: 4.68 x10E6/uL (ref 3.77–5.28)
RDW: 14 % (ref 11.7–15.4)
WBC: 5.6 10*3/uL (ref 3.4–10.8)

## 2023-09-02 LAB — COMPREHENSIVE METABOLIC PANEL WITH GFR
ALT: 29 IU/L (ref 0–32)
AST: 35 IU/L (ref 0–40)
Albumin: 4.7 g/dL (ref 3.8–4.8)
Alkaline Phosphatase: 124 IU/L — ABNORMAL HIGH (ref 44–121)
BUN/Creatinine Ratio: 32 — ABNORMAL HIGH (ref 12–28)
BUN: 24 mg/dL (ref 8–27)
Bilirubin Total: 0.6 mg/dL (ref 0.0–1.2)
CO2: 22 mmol/L (ref 20–29)
Calcium: 10.7 mg/dL — ABNORMAL HIGH (ref 8.7–10.3)
Chloride: 101 mmol/L (ref 96–106)
Creatinine, Ser: 0.74 mg/dL (ref 0.57–1.00)
Globulin, Total: 2.6 g/dL (ref 1.5–4.5)
Glucose: 124 mg/dL — ABNORMAL HIGH (ref 70–99)
Potassium: 3.8 mmol/L (ref 3.5–5.2)
Sodium: 139 mmol/L (ref 134–144)
Total Protein: 7.3 g/dL (ref 6.0–8.5)
eGFR: 83 mL/min/{1.73_m2} (ref >59)

## 2023-09-02 LAB — TSH+FREE T4
Free T4: 0.95 ng/dL (ref 0.82–1.77)
TSH: 5.39 u[IU]/mL — ABNORMAL HIGH (ref 0.450–4.500)

## 2023-09-02 LAB — DIGOXIN LEVEL: Digoxin, Serum: 0.5 ng/mL (ref 0.5–0.9)

## 2023-09-02 LAB — LIPID PANEL
Chol/HDL Ratio: 3.2 ratio (ref 0.0–4.4)
Cholesterol, Total: 197 mg/dL (ref 100–199)
HDL: 61 mg/dL (ref >39)
LDL Chol Calc (NIH): 119 mg/dL — ABNORMAL HIGH (ref 0–99)
Triglycerides: 92 mg/dL (ref 0–149)
VLDL Cholesterol Cal: 17 mg/dL (ref 5–40)

## 2023-09-05 NOTE — Progress Notes (Signed)
 [facility] Date of Birth: 10-19-1945   History of Present Illness: [facility] is seen back today for follow up HTN. She has a history of HTN and hyperlipidemia. She is s/p left CEA in 2014. She has a history of hypothyroidism.  On prior visit she was noted to have a chronically elevated calcium  level. PTH level was mildly elevated. No history of renal stones. No fractures. Normal renal function. She has not been taking calcium  supplements. She did stop her supplement of Vitamin D. We did discuss potential for parathyroidectomy but patient deferred.   She denies any palpitations, Chest pain. No edema.  She will get SOB if going up a lot of stairs. She does note some fatigue. She is tolerating medications well.   Current Outpatient Medications on File Prior to Visit  Medication Sig Dispense Refill   amLODipine  (NORVASC ) 10 MG tablet Take 1 tablet (10 mg total) by mouth daily. 90 tablet 3   aspirin  EC 81 MG tablet Take 1 tablet (81 mg total) by mouth daily. 30 tablet 11   b complex vitamins tablet Take 1 tablet by mouth daily.      digoxin  (LANOXIN ) 0.125 MG tablet Take 1 tablet (125 mcg total) by mouth daily. 90 tablet 3   Glucosamine-Chondroitin-MSM (TRIPLE FLEX PO) Take 1 tablet by mouth as directed.     Homeopathic Products (SIMILASAN DRY EYE RELIEF) SOLN Place 1 drop into both eyes as needed (dry eyes).     hydrochlorothiazide  (HYDRODIURIL ) 25 MG tablet Take 1 tablet (25 mg total) by mouth daily. 90 tablet 3   irbesartan  (AVAPRO ) 300 MG tablet Take 1 tablet (300 mg total) by mouth daily. Pt must keep upcoming appt in Dec for further refills 90 tablet 3   levothyroxine  (SYNTHROID ) 100 MCG tablet TAKE 1/4 TABLET BY MOUTH DAILY BEFORE BREAKFAST. 90 tablet 3   Magnesium  400 MG CAPS Take 1 capsule by mouth 2 (two) times daily.     NON FORMULARY Primal Lab Cardio Relax AO: Take 1 tab by mouth twice a day     NON FORMULARY Primal MyoCor Balance: Take 1 tab by mouth twice a day     OVER THE  COUNTER MEDICATION Take 1 tablet by mouth daily. Natural thyroid  support     OVER THE COUNTER MEDICATION Take 1 capsule by mouth daily. polycosanol natural supplement     POTASSIUM PO Take by mouth.     No current facility-administered medications on file prior to visit.    Allergies  Allergen Reactions   Molds & Smuts Anaphylaxis and Shortness Of Breath   Broccoli [Brassica Oleracea]     GI upset   Chicken Protein     Does not eat meat   Clindamycin Other (See Comments)    teeth hurt   Milk-Related Compounds     GI upset   Mushroom Extract Complex (Obsolete)     GI upset   Onion     GI upset   Penicillins Other (See Comments)    skin crawls    Past Medical History:  Diagnosis Date   Carotid artery occlusion    Hyperlipidemia 08/17/2013   Hypertension    Hypothyroidism    Refuses conventional medicine   PAF (paroxysmal atrial fibrillation) (HCC)    Seasonal allergies     Past Surgical History:  Procedure Laterality Date   APPENDECTOMY     CAROTID ENDARTERECTOMY     CERVICAL POLYPECTOMY     DOPPLER ECHOCARDIOGRAPHY  03/02/1999  NORMAL LEFT VENTRICULAR SIZE. EF 55%   ENDARTERECTOMY Left 09/24/2012   Procedure: ENDARTERECTOMY CAROTID;  Surgeon: Ashley JONETTA Collum, MD;  Location: [facility];  Service: Vascular;  Laterality: Left;   EXTERNAL EAR SURGERY Right 1983   tympanoplasty with patch   PATCH ANGIOPLASTY Left 09/24/2012   Procedure: PATCH ANGIOPLASTY;  Surgeon: Ashley JONETTA Collum, MD;  Location: [facility];  Service: Vascular;  Laterality: Left;   TUBAL LIGATION      Social History   Tobacco Use  Smoking Status Never  Smokeless Tobacco Never    Social History   Substance and Sexual Activity  Alcohol  Use No   Alcohol /week: 0.0 standard drinks of alcohol     Family History  Problem Relation Age of Onset   Alzheimer's disease Mother    Stroke Father    Colon cancer Neg Hx    Esophageal cancer Neg Hx    Liver cancer Neg Hx    Pancreatic cancer Neg Hx    Rectal  cancer Neg Hx    Stomach cancer Neg Hx     Review of Systems: As noted in HPI.   All other systems were reviewed and are negative.  Physical Exam: BP (!) 154/70 (BP Location: Left Arm, Patient Position: Sitting, Cuff Size: Normal)   Pulse 63   Ht 5' 10 (1.778 m)   Wt 143 lb 3.2 oz (65 kg)   SpO2 99%   BMI 20.55 kg/m  GENERAL:  Well appearing WF in NAD HEENT:  Normal.  NECK:  No jugular venous distention, carotid upstroke brisk and symmetric, no bruits, no thyromegaly or adenopathy LUNGS:  Clear to auscultation bilaterally CHEST:  Unremarkable HEART:  RRR,  PMI not displaced or sustained,S1 and S2 within normal limits, no S3, no S4: no clicks, no rubs, no murmurs ABD:  Soft, nontender. BS +, no masses or bruits. No hepatomegaly, no splenomegaly EXT:  2 + pulses throughout, no edema, no cyanosis no clubbing SKIN:  Warm and dry.  No rashes NEURO:  Alert and oriented x 3. Cranial nerves II through XII intact. PSYCH:  Cognitively intact      LABORATORY DATA:    Lab Results  Component Value Date   WBC 5.6 09/01/2023   HGB 15.4 09/01/2023   HCT 45.9 09/01/2023   PLT 204 09/01/2023   GLUCOSE 124 (H) 09/01/2023   CHOL 197 09/01/2023   TRIG 92 09/01/2023   HDL 61 09/01/2023   LDLDIRECT 144.4 09/08/2012   LDLCALC 119 (H) 09/01/2023   ALT 29 09/01/2023   AST 35 09/01/2023   NA 139 09/01/2023   K 3.8 09/01/2023   CL 101 09/01/2023   CREATININE 0.74 09/01/2023   BUN 24 09/01/2023   CO2 22 09/01/2023   TSH 5.390 (H) 09/01/2023   INR 1.00 09/22/2012   EKG Interpretation Date/Time:  Wednesday September 10 2023 15:43:17 EDT Ventricular Rate:  63 PR Interval:  166 QRS Duration:  92 QT Interval:  416 QTC Calculation: 425 R Axis:   -63  Text Interpretation: Normal sinus rhythm Possible Left atrial enlargement Left axis deviation Pulmonary disease pattern Incomplete right bundle branch block Nonspecific ST abnormality Confirmed by [facility], [facility] [phone]) on 09/10/2023 4:00:43 PM     Assessment / Plan: 1. Carotid arterial disease s/p left CEA.  Continue ASA. Dopplers in October 2019 showed no significant stenosis.  No stenosis by doppler June 2023  2. HTN - BP high today but has been under good control.  Continue current therapy.   3.  Hyperlipidemia. LDL increased from 72- 119. Reports compliance with lipitor. Will increase dose to 20 mg daily and repeat lab in 3 months.   4. Paroxysmal AFib- remote history - no symptomatic recurrence. On digoxin . Level ok.   Off Toprol  now.  5. Hypothyroidism on synthroid . TSH mildly elevated but T4 normal. States she feels nervous on higher dose. Will continue same.   6. Hypercalcemia. PTH consistent with mild hyperparathyroidism. Calcium  level has been stable for years and she is asymptomatic.   7. Elevated blood sugar. Recommend avoidance of sweets. Will check A1c next blood draw.     I will follow up in 6 months

## 2023-09-10 ENCOUNTER — Ambulatory Visit: Payer: MEDICARE | Attending: Cardiology | Admitting: Cardiology

## 2023-09-10 VITALS — BP 154/70 | HR 63 | Ht 70.0 in | Wt 143.2 lb

## 2023-09-10 DIAGNOSIS — I10 Essential (primary) hypertension: Secondary | ICD-10-CM | POA: Diagnosis not present

## 2023-09-10 DIAGNOSIS — E7800 Pure hypercholesterolemia, unspecified: Secondary | ICD-10-CM | POA: Diagnosis not present

## 2023-09-10 DIAGNOSIS — R739 Hyperglycemia, unspecified: Secondary | ICD-10-CM

## 2023-09-10 MED ORDER — ATORVASTATIN CALCIUM 20 MG PO TABS: 20.0000 mg | ORAL_TABLET | Freq: Every day | ORAL | 3 refills | Status: AC

## 2023-09-10 NOTE — Patient Instructions (Signed)
 Medication Instructions:  Increase Atorvastatin  to 20 mg daily Continue all other medications *If you need a refill on your cardiac medications before your next appointment, please call your pharmacy*  Lab Work: Have fasting lab,cmet,lipid panel,A1c in mid Sept   Testing/Procedures: None ordered  Follow-Up: At [facility], you and your health needs are our priority.  As part of our continuing mission to provide you with exceptional heart care, our providers are all part of one team.  This team includes your primary Cardiologist (physician) and Advanced Practice Providers or APPs (Physician Assistants and Nurse Practitioners) who all work together to provide you with the care you need, when you need it.  Your next appointment:  6 months   Call in July to schedule Dec appointment     Provider:  Dr.Jordan   We recommend signing up for the patient portal called MyChart.  Sign up information is provided on this After Visit Summary.  MyChart is used to connect with patients for Virtual Visits (Telemedicine).  Patients are able to view lab/test results, encounter notes, upcoming appointments, etc.  Non-urgent messages can be sent to your provider as well.   To learn more about what you can do with MyChart, go to [email or web address]

## 2023-10-03 ENCOUNTER — Encounter: Payer: Self-pay | Admitting: Cardiology

## 2023-11-10 ENCOUNTER — Telehealth: Payer: Self-pay | Admitting: Cardiology

## 2023-11-10 NOTE — Telephone Encounter (Signed)
 Pt states that she forgot to start taking 20 mg of atorvastatin  after her appt in June, so she will go in for her lab work in October to get an accurate result, rather than mid-September as her AVS states. She will begin taking 20 mg tonight.

## 2024-02-05 DIAGNOSIS — K080 Exfoliation of teeth due to systemic causes: Secondary | ICD-10-CM | POA: Diagnosis not present

## 2024-02-26 DIAGNOSIS — I10 Essential (primary) hypertension: Secondary | ICD-10-CM | POA: Diagnosis not present

## 2024-02-26 DIAGNOSIS — R739 Hyperglycemia, unspecified: Secondary | ICD-10-CM | POA: Diagnosis not present

## 2024-02-26 DIAGNOSIS — E7800 Pure hypercholesterolemia, unspecified: Secondary | ICD-10-CM | POA: Diagnosis not present

## 2024-02-26 LAB — LIPID PANEL

## 2024-02-27 ENCOUNTER — Ambulatory Visit: Payer: Self-pay | Admitting: Cardiology

## 2024-02-27 LAB — HEMOGLOBIN A1C
Est. average glucose Bld gHb Est-mCnc: 103 mg/dL
Hgb A1c MFr Bld: 5.2 % (ref 4.8–5.6)

## 2024-02-27 LAB — LIPID PANEL
Cholesterol, Total: 140 mg/dL (ref 100–199)
HDL: 52 mg/dL (ref >39)
LDL CALC COMMENT:: 2.7 ratio (ref 0.0–4.4)
LDL Chol Calc (NIH): 76 mg/dL (ref 0–99)
Triglycerides: 58 mg/dL (ref 0–149)
VLDL Cholesterol Cal: 12 mg/dL (ref 5–40)

## 2024-02-27 LAB — COMPREHENSIVE METABOLIC PANEL WITH GFR
ALT: 24 IU/L (ref 0–32)
AST: 35 IU/L (ref 0–40)
Albumin: 4.9 g/dL — ABNORMAL HIGH (ref 3.8–4.8)
Alkaline Phosphatase: 103 IU/L (ref 49–135)
BUN/Creatinine Ratio: 47 — ABNORMAL HIGH (ref 12–28)
BUN: 33 mg/dL — ABNORMAL HIGH (ref 8–27)
Bilirubin Total: 0.6 mg/dL (ref 0.0–1.2)
CO2: 25 mmol/L (ref 20–29)
Calcium: 10.2 mg/dL (ref 8.7–10.3)
Chloride: 100 mmol/L (ref 96–106)
Creatinine, Ser: 0.7 mg/dL (ref 0.57–1.00)
Globulin, Total: 2.3 g/dL (ref 1.5–4.5)
Glucose: 105 mg/dL — ABNORMAL HIGH (ref 70–99)
Potassium: 3.6 mmol/L (ref 3.5–5.2)
Sodium: 141 mmol/L (ref 134–144)
Total Protein: 7.2 g/dL (ref 6.0–8.5)
eGFR: 88 mL/min/1.73 (ref >59)

## 2024-03-10 NOTE — Progress Notes (Unsigned)
 [facility] Date of Birth: 04-16-1945   History of Present Illness: [facility] is seen back today for follow up HTN. She has a history of HTN and hyperlipidemia. She is s/p left CEA in 2014. She has a history of hypothyroidism.  On prior visit she was noted to have a chronically elevated calcium  level. PTH level was mildly elevated. No history of renal stones. No fractures. Normal renal function. She has not been taking calcium  supplements. She did stop her supplement of Vitamin D. We did discuss potential for parathyroidectomy but patient deferred.   She denies any palpitations, Chest pain. No edema.  She will get SOB if going up a lot of stairs. She does note some fatigue. She is tolerating medications well.   Current Outpatient Medications on File Prior to Visit  Medication Sig Dispense Refill   amLODipine  (NORVASC ) 10 MG tablet Take 1 tablet (10 mg total) by mouth daily. 90 tablet 3   aspirin  EC 81 MG tablet Take 1 tablet (81 mg total) by mouth daily. 30 tablet 11   atorvastatin  (LIPITOR) 20 MG tablet Take 1 tablet (20 mg total) by mouth daily. 90 tablet 3   b complex vitamins tablet Take 1 tablet by mouth daily.      digoxin  (LANOXIN ) 0.125 MG tablet Take 1 tablet (125 mcg total) by mouth daily. 90 tablet 3   Glucosamine-Chondroitin-MSM (TRIPLE FLEX PO) Take 1 tablet by mouth as directed.     Homeopathic Products (SIMILASAN DRY EYE RELIEF) SOLN Place 1 drop into both eyes as needed (dry eyes).     hydrochlorothiazide  (HYDRODIURIL ) 25 MG tablet Take 1 tablet (25 mg total) by mouth daily. 90 tablet 3   irbesartan  (AVAPRO ) 300 MG tablet Take 1 tablet (300 mg total) by mouth daily. Pt must keep upcoming appt in Dec for further refills 90 tablet 3   levothyroxine  (SYNTHROID ) 100 MCG tablet TAKE 1/4 TABLET BY MOUTH DAILY BEFORE BREAKFAST. 90 tablet 3   Magnesium  400 MG CAPS Take 1 capsule by mouth 2 (two) times daily.     NON FORMULARY Primal Lab Cardio Relax AO: Take 1 tab by mouth twice a  day     NON FORMULARY Primal MyoCor Balance: Take 1 tab by mouth twice a day     OVER THE COUNTER MEDICATION Take 1 tablet by mouth daily. Natural thyroid  support     OVER THE COUNTER MEDICATION Take 1 capsule by mouth daily. polycosanol natural supplement     POTASSIUM PO Take by mouth.     No current facility-administered medications on file prior to visit.    Allergies  Allergen Reactions   Molds & Smuts Anaphylaxis and Shortness Of Breath   Broccoli [Brassica Oleracea]     GI upset   Chicken Protein     Does not eat meat   Clindamycin Other (See Comments)    teeth hurt   Milk-Related Compounds     GI upset   Mushroom Extract Complex (Obsolete)     GI upset   Onion     GI upset   Penicillins Other (See Comments)    skin crawls    Past Medical History:  Diagnosis Date   Carotid artery occlusion    Hyperlipidemia 08/17/2013   Hypertension    Hypothyroidism    Refuses conventional medicine   PAF (paroxysmal atrial fibrillation) (HCC)    Seasonal allergies     Past Surgical History:  Procedure Laterality Date   APPENDECTOMY  CAROTID ENDARTERECTOMY     CERVICAL POLYPECTOMY     DOPPLER ECHOCARDIOGRAPHY  03/02/1999   NORMAL LEFT VENTRICULAR SIZE. EF 55%   ENDARTERECTOMY Left 09/24/2012   Procedure: ENDARTERECTOMY CAROTID;  Surgeon: Ashley JONETTA Collum, MD;  Location: [facility] OR;  Service: Vascular;  Laterality: Left;   EXTERNAL EAR SURGERY Right 1983   tympanoplasty with patch   PATCH ANGIOPLASTY Left 09/24/2012   Procedure: PATCH ANGIOPLASTY;  Surgeon: Ashley JONETTA Collum, MD;  Location: [facility] OR;  Service: Vascular;  Laterality: Left;   TUBAL LIGATION      Social History   Tobacco Use  Smoking Status Never  Smokeless Tobacco Never    Social History   Substance and Sexual Activity  Alcohol  Use No   Alcohol /week: 0.0 standard drinks of alcohol     Family History  Problem Relation Age of Onset   Alzheimer's disease Mother    Stroke Father    Colon cancer Neg Hx     Esophageal cancer Neg Hx    Liver cancer Neg Hx    Pancreatic cancer Neg Hx    Rectal cancer Neg Hx    Stomach cancer Neg Hx     Review of Systems: As noted in HPI.   All other systems were reviewed and are negative.  Physical Exam: There were no vitals taken for this visit. GENERAL:  Well appearing WF in NAD HEENT:  Normal.  NECK:  No jugular venous distention, carotid upstroke brisk and symmetric, no bruits, no thyromegaly or adenopathy LUNGS:  Clear to auscultation bilaterally CHEST:  Unremarkable HEART:  RRR,  PMI not displaced or sustained,S1 and S2 within normal limits, no S3, no S4: no clicks, no rubs, no murmurs ABD:  Soft, nontender. BS +, no masses or bruits. No hepatomegaly, no splenomegaly EXT:  2 + pulses throughout, no edema, no cyanosis no clubbing SKIN:  Warm and dry.  No rashes NEURO:  Alert and oriented x 3. Cranial nerves II through XII intact. PSYCH:  Cognitively intact      LABORATORY DATA:    Lab Results  Component Value Date   WBC 5.6 09/01/2023   HGB 15.4 09/01/2023   HCT 45.9 09/01/2023   PLT 204 09/01/2023   GLUCOSE 105 (H) 02/26/2024   CHOL 140 02/26/2024   TRIG 58 02/26/2024   HDL 52 02/26/2024   LDLDIRECT 144.4 09/08/2012   LDLCALC 76 02/26/2024   ALT 24 02/26/2024   AST 35 02/26/2024   NA 141 02/26/2024   K 3.6 02/26/2024   CL 100 02/26/2024   CREATININE 0.70 02/26/2024   BUN 33 (H) 02/26/2024   CO2 25 02/26/2024   TSH 5.390 (H) 09/01/2023   INR 1.00 09/22/2012   HGBA1C 5.2 02/26/2024        Assessment / Plan: 1. Carotid arterial disease s/p left CEA.  Continue ASA. Dopplers in October 2019 showed no significant stenosis.  No stenosis by doppler June 2023  2. HTN - BP high today but has been under good control.  Continue current therapy.   3. Hyperlipidemia. LDL increased from 72- 119. Reports compliance with lipitor. Will increase dose to 20 mg daily and repeat lab in 3 months.   4. Paroxysmal AFib- remote history - no  symptomatic recurrence. On digoxin . Level ok.   Off Toprol  now.  5. Hypothyroidism on synthroid . TSH mildly elevated but T4 normal. States she feels nervous on higher dose. Will continue same.   6. Hypercalcemia. PTH consistent with mild hyperparathyroidism. Calcium  level has been  stable for years and she is asymptomatic.   7. Elevated blood sugar. Recommend avoidance of sweets. Will check A1c next blood draw.     I will follow up in 6 months

## 2024-03-11 ENCOUNTER — Ambulatory Visit: Attending: Cardiovascular Disease | Admitting: Cardiology

## 2024-03-11 ENCOUNTER — Encounter: Payer: Self-pay | Admitting: Cardiology

## 2024-03-11 VITALS — BP 152/68 | HR 70 | Ht 70.0 in | Wt 141.2 lb

## 2024-03-11 DIAGNOSIS — E7800 Pure hypercholesterolemia, unspecified: Secondary | ICD-10-CM

## 2024-03-11 DIAGNOSIS — I10 Essential (primary) hypertension: Secondary | ICD-10-CM | POA: Diagnosis not present

## 2024-03-11 DIAGNOSIS — I6522 Occlusion and stenosis of left carotid artery: Secondary | ICD-10-CM

## 2024-03-11 NOTE — Patient Instructions (Addendum)
 Medication Instructions:  Continue all medications *If you need a refill on your cardiac medications before your next appointment, please call your pharmacy*  Lab Work: None ordered   Testing/Procedures: None ordered  Follow-Up: At [facility], you and your health needs are our priority.  As part of our continuing mission to provide you with exceptional heart care, our providers are all part of one team.  This team includes your primary Cardiologist (physician) and Advanced Practice Providers or APPs (Physician Assistants and Nurse Practitioners) who all work together to provide you with the care you need, when you need it.  Your next appointment:  6 months     Call in Feb to schedule June appointment     Provider:  Dr.Jordan  We recommend signing up for the patient portal called MyChart.  Sign up information is provided on this After Visit Summary.  MyChart is used to connect with patients for Virtual Visits (Telemedicine).  Patients are able to view lab/test results, encounter notes, upcoming appointments, etc.  Non-urgent messages can be sent to your provider as well.   To learn more about what you can do with MyChart, go to [email or web address].

## 2024-04-21 ENCOUNTER — Other Ambulatory Visit: Payer: Self-pay | Admitting: Cardiology

## 2024-04-24 ENCOUNTER — Other Ambulatory Visit: Payer: Self-pay | Admitting: Cardiology

## 2024-04-26 NOTE — Telephone Encounter (Signed)
 No Magnesium Labs  In accordance with refill protocols, please review and address the following requirements before this medication refill can be authorized:  Labs

## 2024-04-27 ENCOUNTER — Other Ambulatory Visit: Payer: Self-pay | Admitting: Cardiology

## 2024-04-30 MED ORDER — DIGOXIN 125 MCG PO TABS: 125.0000 ug | ORAL_TABLET | Freq: Every day | ORAL | 3 refills | Status: AC

## 2024-04-30 MED ORDER — HYDROCHLOROTHIAZIDE 25 MG PO TABS: 25.0000 mg | ORAL_TABLET | Freq: Every day | ORAL | 3 refills | Status: AC

## 2024-04-30 MED ORDER — AMLODIPINE BESYLATE 10 MG PO TABS: 10.0000 mg | ORAL_TABLET | Freq: Every day | ORAL | 3 refills | Status: AC

## 2024-04-30 NOTE — Telephone Encounter (Signed)
 In accordance with refill protocols, please review and address the following requirements before this medication refill can be authorized:  Labs  Pt needs Mg labs done within 365 days within normal range

## 2024-09-09 ENCOUNTER — Ambulatory Visit: Admitting: Cardiology
